# Patient Record
Sex: Female | Born: 1935 | Race: White | Hispanic: No | Marital: Single | Smoking: Never smoker
Health system: Southern US, Community
[De-identification: ages and names within clinical notes are randomized; demographics above are authoritative.]

## PROBLEM LIST (undated history)

## (undated) DIAGNOSIS — I639 Cerebral infarction, unspecified: Secondary | ICD-10-CM

## (undated) DIAGNOSIS — F0391 Unspecified dementia with behavioral disturbance: Secondary | ICD-10-CM

## (undated) DIAGNOSIS — I10 Essential (primary) hypertension: Secondary | ICD-10-CM

## (undated) HISTORY — DX: Unspecified dementia with behavioral disturbance: F03.91

## (undated) HISTORY — PX: APPENDECTOMY: SHX54

## (undated) HISTORY — PX: ANKLE SURGERY: SHX546

## (undated) HISTORY — DX: Essential (primary) hypertension: I10

---

## 2010-02-27 ENCOUNTER — Ambulatory Visit: Payer: Self-pay | Admitting: Family Medicine

## 2010-02-27 ENCOUNTER — Inpatient Hospital Stay (HOSPITAL_COMMUNITY): Admission: EM | Admit: 2010-02-27 | Discharge: 2010-03-03 | Payer: Self-pay | Admitting: Emergency Medicine

## 2010-02-27 ENCOUNTER — Emergency Department (HOSPITAL_COMMUNITY)
Admission: EM | Admit: 2010-02-27 | Discharge: 2010-02-27 | Payer: Self-pay | Source: Home / Self Care | Admitting: Family Medicine

## 2010-02-28 DIAGNOSIS — F4320 Adjustment disorder, unspecified: Secondary | ICD-10-CM

## 2010-03-11 ENCOUNTER — Ambulatory Visit: Payer: Self-pay | Admitting: Family Medicine

## 2010-03-11 DIAGNOSIS — F0391 Unspecified dementia with behavioral disturbance: Secondary | ICD-10-CM

## 2010-03-11 DIAGNOSIS — F03918 Unspecified dementia, unspecified severity, with other behavioral disturbance: Secondary | ICD-10-CM

## 2010-03-11 DIAGNOSIS — I10 Essential (primary) hypertension: Secondary | ICD-10-CM

## 2010-03-11 DIAGNOSIS — R269 Unspecified abnormalities of gait and mobility: Secondary | ICD-10-CM

## 2010-03-11 HISTORY — DX: Unspecified dementia, unspecified severity, with other behavioral disturbance: F03.918

## 2010-03-11 HISTORY — DX: Unspecified dementia with behavioral disturbance: F03.91

## 2010-04-15 ENCOUNTER — Ambulatory Visit: Admit: 2010-04-15 | Payer: Self-pay

## 2010-05-12 NOTE — Assessment & Plan Note (Signed)
Summary: hfu,eo   Vital Signs:  Patient profile:   77 year old female Height:      63.5 inches Weight:      145 pounds BMI:     25.37 BSA:     1.70 Temp:     97.8 degrees F Pulse rate:   74 / minute BP sitting:   170 / 68  Vitals Entered By: Delora Fuel (March 11, 2010 10:30 AM) CC: HFU Is Patient Diabetic? No Pain Assessment Patient in pain? no        CC:  HFU.  History of Present Illness: Gait instability Here for follow up hospitilization see DC summary for details.  According to patient and her son she has been doing well at home.  No falls uses a walker around the home.  Home health, PT and SW are all coming and have rearranged furniture and taken all her rugs.  Dementia mostly short term memory loss.  Not distressing to her.  Son feels is stable  ROS - as above PMH - Medications reviewed and updated in medication list.  Smoking Status noted in VS form    Habits & Providers  Alcohol-Tobacco-Diet     Tobacco Status: never  Current Medications (verified): 1)  Drisdol 16109 Unit Caps (Ergocalciferol) .Marland Kitchen.. 1 Per Week 2)  Aspir-Low 81 Mg Tbec (Aspirin) .Marland Kitchen.. 1 Daily 3)  Multivitamins  Tabs (Multiple Vitamin) .Marland Kitchen.. 1daily  Allergies (verified): No Known Drug Allergies  Social History: Lives by herself.  Son is in frequent contact.  Will have an alert system starting in Dec 11.  Smoking Status:  never  Physical Exam  General:  Well-developed,well-nourished,in no acute distress; alert,appropriate and cooperative throughout examination Lungs:  Normal respiratory effort, chest expands symmetrically. Lungs are clear to auscultation, no crackles or wheezes. Heart:  Normal rate and regular rhythm. S1 and S2 normal without gallop, murmur, click, rub or other extra sounds. Neurologic:  CN 2-7 Normal.  Upper Extremity strength 5/5.  Able to stand on tip toes and heels.  Finger to Nose normal.  Able to rise walk turn return without asst and reasonably rapidly       Impression & Recommendations:  Problem # 1:  GAIT IMBALANCE (ICD-781.2)  Seems to be stable.  On vit d and seeing PT.  Asked to see her eye doctor   Orders: [facility]- Est  Level 4 (60454)  Problem # 2:  DEMENTIA (ICD-294.8)  seems stable.  Will leave discussion of any medicaiton treat for her primary doctor.    Orders: [facility]- Est  Level 4 (99214)  Problem # 3:  ELEVATED BLOOD PRESSURE WITHOUT DIAGNOSIS OF HYPERTENSION (ICD-796.2)  will montior and have HH record at home.   Would not treat unless sys regularluy > 160 or diast > 90-95 BP today: 170/68  Instructed in low sodium diet (DASH Handout) and behavior modification.    Orders: [facility]- Est  Level 4 (09811)  Complete Medication List: 1)  Drisdol 91478 Unit Caps (Ergocalciferol) .Marland Kitchen.. 1 per week 2)  Aspir-low 81 Mg Tbec (Aspirin) .Marland Kitchen.. 1 daily 3)  Multivitamins Tabs (Multiple vitamin) .Marland Kitchen.. 1daily  Patient Instructions: 1)  Come back and see Dr Ashley Royalty in one month 2)  Keep taking the vitamin D once a week 3)  Would start a multivitamin once a day 4)  Have [facility] write down her blood pressures and bring in the readings next visit   Orders Added: 1)  [facility]- Est  Level 4 [29562]

## 2010-05-18 ENCOUNTER — Encounter: Payer: Self-pay | Admitting: *Deleted

## 2010-06-23 LAB — URINALYSIS, ROUTINE W REFLEX MICROSCOPIC
Glucose, UA: NEGATIVE mg/dL
Hgb urine dipstick: NEGATIVE
Specific Gravity, Urine: 1.022 (ref 1.005–1.030)
pH: 5.5 (ref 5.0–8.0)

## 2010-06-23 LAB — CULTURE, BLOOD (ROUTINE X 2)
Culture  Setup Time: 201111190159
Culture  Setup Time: 201111190202
Culture  Setup Time: 201111191258
Culture  Setup Time: 201111191342
Culture: NO GROWTH
Culture: NO GROWTH
Culture: NO GROWTH
Culture: NO GROWTH

## 2010-06-23 LAB — CARDIAC PANEL(CRET KIN+CKTOT+MB+TROPI)
CK, MB: 6.1 ng/mL (ref 0.3–4.0)
Relative Index: 3.8 — ABNORMAL HIGH (ref 0.0–2.5)
Relative Index: 4.2 — ABNORMAL HIGH (ref 0.0–2.5)
Troponin I: 0.16 ng/mL — ABNORMAL HIGH (ref 0.00–0.06)

## 2010-06-23 LAB — COMPREHENSIVE METABOLIC PANEL
ALT: 19 U/L (ref 0–35)
AST: 45 U/L — ABNORMAL HIGH (ref 0–37)
Alkaline Phosphatase: 69 U/L (ref 39–117)
CO2: 23 mEq/L (ref 19–32)
GFR calc Af Amer: 56 mL/min — ABNORMAL LOW (ref >60)
Glucose, Bld: 105 mg/dL — ABNORMAL HIGH (ref 70–99)
Potassium: 3.7 mEq/L (ref 3.5–5.1)
Sodium: 137 mEq/L (ref 135–145)
Total Protein: 6.5 g/dL (ref 6.0–8.3)

## 2010-06-23 LAB — BASIC METABOLIC PANEL WITH GFR
CO2: 23 meq/L (ref 19–32)
Chloride: 108 meq/L (ref 96–112)
GFR calc Af Amer: >60 mL/min (ref >60)
Potassium: 3.4 meq/L — ABNORMAL LOW (ref 3.5–5.1)
Sodium: 138 meq/L (ref 135–145)

## 2010-06-23 LAB — CBC
HCT: 36.4 % (ref 36.0–46.0)
HCT: 39.7 % (ref 36.0–46.0)
HCT: 42 % (ref 36.0–46.0)
Hemoglobin: 13.3 g/dL (ref 12.0–15.0)
Hemoglobin: 14.4 g/dL (ref 12.0–15.0)
MCH: 28.9 pg (ref 26.0–34.0)
MCH: 29.5 pg (ref 26.0–34.0)
MCHC: 33.5 g/dL (ref 30.0–36.0)
MCV: 88 fL (ref 78.0–100.0)
MCV: 88.4 fL (ref 78.0–100.0)
Platelets: 264 10*3/uL (ref 150–400)
Platelets: 276 10*3/uL (ref 150–400)
Platelets: 287 10*3/uL (ref 150–400)
Platelets: 292 10*3/uL (ref 150–400)
RBC: 4.1 MIL/uL (ref 3.87–5.11)
RBC: 4.51 MIL/uL (ref 3.87–5.11)
RDW: 12.8 % (ref 11.5–15.5)
RDW: 13.1 % (ref 11.5–15.5)
RDW: 13.1 % (ref 11.5–15.5)
RDW: 13.2 % (ref 11.5–15.5)
RDW: 13.2 % (ref 11.5–15.5)
WBC: 10 10*3/uL (ref 4.0–10.5)
WBC: 15.4 10*3/uL — ABNORMAL HIGH (ref 4.0–10.5)
WBC: 19.6 10*3/uL — ABNORMAL HIGH (ref 4.0–10.5)
WBC: 7.7 10*3/uL (ref 4.0–10.5)

## 2010-06-23 LAB — BASIC METABOLIC PANEL
BUN: 10 mg/dL (ref 6–23)
BUN: 14 mg/dL (ref 6–23)
BUN: 16 mg/dL (ref 6–23)
BUN: 8 mg/dL (ref 6–23)
CO2: 23 mEq/L (ref 19–32)
Calcium: 9.1 mg/dL (ref 8.4–10.5)
Calcium: 9.3 mg/dL (ref 8.4–10.5)
Calcium: 9.4 mg/dL (ref 8.4–10.5)
Chloride: 109 mEq/L (ref 96–112)
Chloride: 110 mEq/L (ref 96–112)
Creatinine, Ser: 0.77 mg/dL (ref 0.4–1.2)
Creatinine, Ser: 0.79 mg/dL (ref 0.4–1.2)
Creatinine, Ser: 0.94 mg/dL (ref 0.4–1.2)
GFR calc Af Amer: >60 mL/min (ref >60)
GFR calc non Af Amer: 56 mL/min — ABNORMAL LOW (ref >60)
GFR calc non Af Amer: >60 mL/min (ref >60)
GFR calc non Af Amer: >60 mL/min (ref >60)
Glucose, Bld: 109 mg/dL — ABNORMAL HIGH (ref 70–99)
Potassium: 3.6 mEq/L (ref 3.5–5.1)

## 2010-06-23 LAB — DIFFERENTIAL
Basophils Relative: 0 % (ref 0–1)
Eosinophils Absolute: 0 10*3/uL (ref 0.0–0.7)
Eosinophils Relative: 0 % (ref 0–5)
Lymphs Abs: 1 10*3/uL (ref 0.7–4.0)
Monocytes Relative: 6 % (ref 3–12)
Neutrophils Relative %: 89 % — ABNORMAL HIGH (ref 43–77)

## 2010-06-23 LAB — URINE CULTURE

## 2010-06-23 LAB — LIPID PANEL
HDL: 38 mg/dL — ABNORMAL LOW (ref >39)
LDL Cholesterol: 74 mg/dL (ref 0–99)
Triglycerides: 78 mg/dL (ref <150)
VLDL: 16 mg/dL (ref 0–40)

## 2010-06-23 LAB — LACTIC ACID, PLASMA: Lactic Acid, Venous: 2.8 mmol/L — ABNORMAL HIGH (ref 0.5–2.2)

## 2010-06-23 LAB — RAPID URINE DRUG SCREEN, HOSP PERFORMED
Amphetamines: NOT DETECTED
Barbiturates: NOT DETECTED
Opiates: NOT DETECTED

## 2010-06-23 LAB — CK
Total CK: 205 U/L — ABNORMAL HIGH (ref 7–177)
Total CK: 369 U/L — ABNORMAL HIGH (ref 7–177)

## 2010-06-23 LAB — VITAMIN D 25 HYDROXY (VIT D DEFICIENCY, FRACTURES): Vit D, 25-Hydroxy: 27 ng/mL — ABNORMAL LOW (ref 30–89)

## 2010-06-23 LAB — VITAMIN D 1,25 DIHYDROXY
Vitamin D 1, 25 (OH)2 Total: 45 pg/mL (ref 18–72)
Vitamin D2 1, 25 (OH)2: <8 pg/mL
Vitamin D3 1, 25 (OH)2: 45 pg/mL

## 2010-06-23 LAB — URINE MICROSCOPIC-ADD ON

## 2010-06-23 LAB — TSH
TSH: 1.342 u[IU]/mL (ref 0.350–4.500)
TSH: 1.539 u[IU]/mL (ref 0.350–4.500)

## 2010-06-23 LAB — ETHANOL: Alcohol, Ethyl (B): <5 mg/dL (ref 0–10)

## 2010-06-23 LAB — PROTIME-INR: INR: 1.32 (ref 0.00–1.49)

## 2012-08-21 ENCOUNTER — Ambulatory Visit (INDEPENDENT_AMBULATORY_CARE_PROVIDER_SITE_OTHER): Payer: Medicare Other | Admitting: Family Medicine

## 2012-08-21 ENCOUNTER — Encounter: Payer: Self-pay | Admitting: Family Medicine

## 2012-08-21 VITALS — BP 187/108 | HR 69 | Ht 63.5 in | Wt 150.0 lb

## 2012-08-21 DIAGNOSIS — R269 Unspecified abnormalities of gait and mobility: Secondary | ICD-10-CM

## 2012-08-21 DIAGNOSIS — R030 Elevated blood-pressure reading, without diagnosis of hypertension: Secondary | ICD-10-CM

## 2012-08-21 DIAGNOSIS — Z9181 History of falling: Secondary | ICD-10-CM

## 2012-08-21 DIAGNOSIS — L602 Onychogryphosis: Secondary | ICD-10-CM

## 2012-08-21 DIAGNOSIS — L608 Other nail disorders: Secondary | ICD-10-CM

## 2012-08-21 DIAGNOSIS — F068 Other specified mental disorders due to known physiological condition: Secondary | ICD-10-CM

## 2012-08-21 NOTE — Patient Instructions (Signed)
Thank you for coming in, today! For your toenail:  I can remove your toenails, but I can't do it today.  I think it is a better idea for you to be seen by the foot doctor. They are specialists.  They can hopefully trim your nails without having to remove them completely.  They can also look at all of your feet and see if you need anything else.  I will make you a referral and hopefully get you an appointment as soon as possible. Your blood pressure is very high today.  High blood pressure can make it more likely for you to have a heart attack or a stroke.  Blood pressure medicine can make you more likely to fall.  I think for now it is a bigger risk to give you medicine than to just watch your blood pressure. I would like you to come back to see our [facility] doctors.  They specialize in helping take care of older patients.  They will also help make sure that you're doing well and that you're safe. Please feel free to call with any questions or concerns at any time, at [phone]. --Dr. Sharmon Revere office: Please make Shannon Stokes an appointment with [facility] as soon as possible (this week, if able).

## 2012-08-21 NOTE — Progress Notes (Signed)
Subjective:    Patient ID: Shannon Stokes, female    DOB: 1916-05-07, 79 y.o.   MRN: [id number]  HPI: Pt is a 79yo female who presents to establish care and with complaint of "something needs to be done about my toes." Pt was managed by our service in 2011 for brief admission after a fall at home (no identified injuries and ruled out for stroke but was at some point started on daily ASA; pt followed up only once, found to be hypertensive but no treatment initiated at that point). Pt is accompanied by a close friend, Lucilla Edin, who is [name] ([name] has HCPOA paperwork and "desire for natural death" paperwork with him; see A&P). Pt states she "doesn't have any big medical problems" and [name] confirms that pt "typically doesn't like to see doctors, and her last regular doctor passed away, himself, about 20 years ago."  Toenails: Pt complains of thick, very long toenails that are easily bent back and painful, that catch when she changes socks. She denies bleeding or signs of infection around her nails. She has taken no medications nor used any creams for fungus. Pt has never seen podiatry but is interested in having the nails removed or trimmed here or by a podiatrist (this is the pt's main concern today and she states that she "thought that's why she was coming in, today").  HTN: Noted during pt's admission, as above, and at f/u with Dr. Deirdre Priest. No medications started at that time; pt states she has never taken medicine for blood pressure. Pt is not averse to the idea of starting a medication but does state she is concerned that if she takes something her blood pressure might go too low and she could fall (see below).  Dementia: Pt was evaluated by psychiatry during the admission mentioned above and found to have capacity to make her own decisions; no record in EPIC of specific concerns/recommendations other than that. Pt has never been on any medication for dementia. [name] is  pt's HCPOA, as above, but he states that pt is "involved in making her own decisions," but he does have to strongly coax her into coming to the doctor (including this visit, today).  Hx of falls: Pt last fell about two years ago (admission mentioned above); pt has not fallen recently but is "a little unsteady sometimes" on her feet and walks with a cane, and does "have a fear of falling again." [name] checks on her frequently. Pt at one point had [facility] services but does not currently. . [name] expresses some concern that pt is very against the idea of living in a nursing home, but worries that she is not able to completely care for herself.  SH/EOL: Pt lives alone at home, with no family in the area (pt never had children, divorced her husband many years ago, etc). Pt feels safe and able to manage herself at home. Pt is a never smoker, and denies EtOH or other illicit substance use. [name] is pt's HCPOA, as above. Pt has a form stating her "natural death desires," which in summary states that she would not want life-prolonging measures in the event of a terminal/vegetative state. On clarification, pt states she would however want normal therapies initiated as needed in the event of acute illness, up to and including CPR, intubation, and artificial respiration.  PMH as above. Surgical history significant for appendectomy and ankle surgery in the remote past. FH noncontributory. In addition to the  above documentation, PMH, surgical history, FH, SH updated in relevant sections of the EMR.  Review of Systems: As above. Additionally, pt states generally feels well, other than occasional pain in her feet secondary to her nails. Otherwise, full 12-system ROS reviewed and all negative.     Objective:   Physical Exam BP 187/108  Pulse 69  Ht 5' 3.5" (1.613 m)  Wt 150 lb (68.04 kg)  BMI 26.15 kg/m2 Gen: well but frail appearing elderly female, very pleasant, in NAD HEENT: [place]/AT,  MMM,  posterior oropharynx clear  Eyes: PERRLA, sclerae/conjunctivae clear, no lid lag; EOMI  Neck: supple without masses, trachea midline/nondeviated; no meningeal signs; thyroid not enlarged Cardio: RRR, no murmur appreciated; LE without edema Pulm: CTAB, no wheezes; normal work of breathing Abd: soft, nontender, BS+, no HSM appreciated MSK and skin: no frank joint effusions or tenderness; strength equal in bilateral upper and lower extremities  marked thoracic kyphosis; no rashes, no cyanosis/clubbing/edema of extremities  Bilateral feet with very thickened, yellowed nails to all toes but no evidence for cellulitis or other frank bacterial infection  Bilateral great toenails very long (R>L, ~4-5 CENTImeterss), curved/bent, partially ingrown Neuro: sensation grossly intact, no obvious focal deficit  Able to sit/stand unassisted; gait slow but steady with use of cane and minimal assistance Psych: mood euthymic with appropriate affect  Formal MMSE not performed today; pt alert and oriented to self and place, time/circumstance not spefically asked  Pt able to provide remote and recent history reasonably well and her thought content and process appear normal     Assessment & Plan:  Discussed with Dr. Michiel Sites. Summary of old records as above. See A&P notes for full details. Referred to podiatry for management of nails. Referred to geriatric clinic for full MMSE and  recommendations on any medications. Defer starting any medications at this time, for HTN or otherwise. MDM complexity: moderate.

## 2012-08-22 ENCOUNTER — Encounter: Payer: Self-pay | Admitting: Family Medicine

## 2012-08-22 DIAGNOSIS — Z9181 History of falling: Secondary | ICD-10-CM | POA: Insufficient documentation

## 2012-08-22 NOTE — Assessment & Plan Note (Signed)
Noted to be 170/86 in 2011, today measured at 187/108; discussed with Dr. Katrinka Blazing. Likely chronic in nature. Would favor not treating with pharmaceuticals due to age and risk of hypotension, especially given hospitalization for a fall believed due to ?gait imbalance 3 years ago. Referred to geriatric clinic and would appreciate further recommendations.

## 2012-08-22 NOTE — Assessment & Plan Note (Signed)
[name] personal main concern from today's visit. Bilateral great toes with very long, curled/deviated, and partially ingrown nails, right worse than left; other toes with very thick, uneven nails. Pt complains of pain mostly due to great toenail length/shape (catching on socks, being bent back easily, etc). Unable to remove great toenails today (not attempted), and believe pt would be better suited to being seen by podiatry for trimming and/or possible removal of multiple nails. Referred to podiatry today, with pt in agreement.

## 2012-08-22 NOTE — Assessment & Plan Note (Addendum)
A: Discussed with Dr. Katrinka Blazing. Appears at baseline but not formally tested with MMSE, today. Hx of ?old infarct per imaging in 2011 (non-acute left occipetal lesion on CT; negative neck MRA, left ICA aneurysm noted). Pt does not grossly appear entirely unable to make decisions but does have a HCPOA to aid in decision-making, Mr. Lucilla Edin, [facility] [phone], Cell [phone])  P: Requested copies of pt's HCPOA paperwork and "natural death" paperwork to be scanned into EPIC. EOL wishes clarified as per progress note (essentially a full code with parameters to withdraw care in a futile/terminal/vegetative state-type situation). Pt referred to geriatric clinic. Would request that visit assess formal MMSE, further fall risk assessment, and any recommendations for medications. Would favor not treating dementia with pharmaceuticals at this point.

## 2012-08-22 NOTE — Assessment & Plan Note (Signed)
Last fall 2 years ago, without injury at that time; no reported falls in the recent past, but pt lives alone without assistance other than "frequent" visits from her close friend/HCPOA, [name]. Gait and balance seem stable, but pt does use a cane and is likely at very high risk for continued falls. Referred to geriatric clinic, as noted above. Would also consider referral to SW and/or [facility] health coaching for further assessment of HH needs; pt has been adamantly against placement in a long-term care facility but appearntly has had HH services in the past.

## 2012-08-31 ENCOUNTER — Ambulatory Visit: Payer: Medicare Other

## 2012-09-11 ENCOUNTER — Ambulatory Visit (INDEPENDENT_AMBULATORY_CARE_PROVIDER_SITE_OTHER): Payer: Medicare Other | Admitting: Family Medicine

## 2012-09-11 ENCOUNTER — Encounter: Payer: Self-pay | Admitting: Family Medicine

## 2012-09-11 VITALS — BP 177/78 | HR 69 | Ht 63.5 in | Wt 149.0 lb

## 2012-09-11 DIAGNOSIS — F068 Other specified mental disorders due to known physiological condition: Secondary | ICD-10-CM

## 2012-09-11 DIAGNOSIS — I10 Essential (primary) hypertension: Secondary | ICD-10-CM

## 2012-09-11 DIAGNOSIS — L602 Onychogryphosis: Secondary | ICD-10-CM

## 2012-09-11 DIAGNOSIS — L608 Other nail disorders: Secondary | ICD-10-CM

## 2012-09-11 MED ORDER — DONEPEZIL HCL 5 MG PO TABS: 5.0000 mg | ORAL_TABLET | Freq: Every evening | ORAL | Status: DC | PRN

## 2012-09-11 NOTE — Progress Notes (Signed)
Subjective:    Patient ID: Shannon Stokes, female    DOB: 1916-07-07, 79 y.o.   MRN: [id number]  HPI: Pt presents for f/u of ingrown/painful toenails, HTN, and for concerns from HCPOA (friend of family Mr. Lucilla Edin) that pt may not be safe at home by herself.  Dementia/HCPOA concerns: Pt lives alone and does her own housework, feeds/clothes herself, walks with a cane. Pt last fell ~2-3 years ago and does not feel like she is worried about falling again (pt hospitalized at that time, see EMR and my last clinic note); pt states "I get around all right by myself at home, and friends like Rosanne Ashing and his wife come in now and then and check on me." Pt does not like other people whom she does not know (i.e., home health people, etc) to come into her home. Pt has in the past adamantly refused SNF. [name] assists with managing pt's finances and driving her to doctor's offices or to buy groceries, etc, but otherwise she is fairly independent. [name] expresses concern (in private away from pt) that she does not always eat well at home and that she may not be safe due to balance/gait issues, and so on. He states that he knows he has HCPOA and does not want to act against Ms. Melvyn Neth' wishes, but he wants to "do right by her" and wants to make sure she is safe. Pt has had HH services in the past but eventually "got to the point where she didn't feel like she needed them and refused to have them come back."  HTN: Elevated in the past and today, ~170's/80's, not on any medication. No new headaches, weakness, change in vision or sensation. Pt concerned that if her blood pressure gets too low (as with taking medications) he could fall again.  Feet: Pt saw podiatrist shortly after my last visit with her, and they trimmed/cleaned up her toenails. No further pain or new signs of infection. No cream/lotions used on feet. Podiatrist recommended a certain type of shoe which pt has gotten and is wearing, today. Mr.  Merlinda Frederick states pt is supposed to see the podiatrist again in about 1 month.  Review of Systems: As above. Generally feels well.     Objective:   Physical Exam BP 177/78  Pulse 69  Ht 5' 3.5" (1.613 m)  Wt 149 lb (67.586 kg)  BMI 25.98 kg/m2 Gen: well-appearing, well-kempt elderly female in NAD MSK: slow, slightly shuffling gait but stands/walks using cane without other assistance  Feet with improved appearance of toenails s/p trimming at podiatrist office; no frank infection/drainage/etc Psych: oriented to self and intermittently to place (states "doctor's office, in [place], [place]" but then asks, "is this a bank?")  Not oriented to time, intermittently appears oriented to circumstance; very tangential, at times hard to redirect  Very pleasant and not actively agitated or psychotic  MMSE 16/30; completed 7th grade education; points received for:   [place], state, country, location, floor of building ([place], [place], [place], first floor of "doctor's office") - 5   Immediate three-item recall (ball, chair, flag) - 3   Identification of "pen" and "computer monitor" (called it a "TV") - 2   Repeated phrase ("no ifs, ands, or buts") - 1   Followed 3-step command ("take this paper, fold it in half, set it on the table") - 3   Followed written instruction ("CLOSE YOUR EYES") -1    Wrote sentence ("It's time to go home.") - 1  Assessment & Plan:  Precepted with Dr. Mauricio Po with input from Drs. Hensel and Sheffield Slider. See problem list notes for details. This visit lasted in excess of 30 minutes, greater than half of which was spent in direct face-to-face counseling and coordination of care.

## 2012-09-11 NOTE — Assessment & Plan Note (Signed)
Remains elevated but to levels previously seen (~170/80). Likely chronic in nature. Favor NOT treating with pharmaceuticals due to age and risk of HYPOtension, especially given that pt lives alone and manages her own household. Will continue to monitor and may consider treatment if pt eventually goes go into a skilled facility with close supervision.

## 2012-09-11 NOTE — Assessment & Plan Note (Signed)
Toenails with improved appearance s/p podiatry visit (trimmed at that visit, but not prescribed any oral or topical antifungal or other medication). Feet otherwise have chronic skin changes consistent with pt's age, but no evidence for frank infection. Advised pt to f/u with podiatry as needed (reportedly podiatrist recommended every 10 weeks).

## 2012-09-11 NOTE — Patient Instructions (Signed)
Thank you for coming in, today! Your feet look good. Go back to see the foot doctor in July. I am writing a prescription for you called donepezil (Aricept).  This medicine should help with your memory.  It is one pill, 5 mg, that you should take once per day at bedtime.  We might go up on the dose when you come back to see me. Your blood pressure is still high, but I'm okay with not treating it. I don't want it to get too low and cause you to fall. I would like you to come back to see me in about 1 month. We'll talk more about your medicines, then. Please feel free to call with any questions or concerns at any time, at [phone]. --Dr. Casper Harrison

## 2012-09-11 NOTE — Assessment & Plan Note (Addendum)
A: Precepted with Dr. Mauricio Po also with input from Drs. Sheffield Slider and [place]. MMSE 16/30 as above. 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 Pt with limited insight and HCPOA Shannon Stokes is reluctant to exercise HCPOA against pt's wishes, at this time. Pt does not appear to be a danger to herself but is likely at risk for falls and failure to thrive, particularly if dementia is progressing more rapidly than is obvious.  P: Pt agreeable to trial of donepezil 5 mg daily for ~3 months. Will f/u in 1 month and potentially increase donepezil to 10 mg daily. Strongly recommended referral to [facility] for home evaluation (RN, PT) to determine if any services would be appropriate for pt; pt adamantly refuses allowing "people she doesn't know" into her home at this time, and Shannon Stokes prefers to "think it over and talk to her about it." Will route this note to [facility] and [facility] providers to ask for any further insight/recommendations for community services, and will readdress referral for HH eval at f/u. Dr. Mauricio Po brought up consideration of home visit through/with geriatric clinic providers, which could be a potential intervention to help guide decision-making.

## 2012-10-12 ENCOUNTER — Ambulatory Visit: Payer: Medicare Other | Admitting: Family Medicine

## 2012-11-23 ENCOUNTER — Encounter (HOSPITAL_COMMUNITY): Payer: Self-pay | Admitting: Emergency Medicine

## 2012-11-23 ENCOUNTER — Emergency Department (HOSPITAL_COMMUNITY): Payer: Medicare Other

## 2012-11-23 ENCOUNTER — Inpatient Hospital Stay (HOSPITAL_COMMUNITY)
Admission: EM | Admit: 2012-11-23 | Discharge: 2012-11-28 | DRG: 064 | Disposition: A | Discharge status: Skilled Nursing Facility | Payer: Medicare Other | Attending: Family Medicine | Admitting: Family Medicine

## 2012-11-23 DIAGNOSIS — J189 Pneumonia, unspecified organism: Secondary | ICD-10-CM | POA: Diagnosis present

## 2012-11-23 DIAGNOSIS — F0390 Unspecified dementia without behavioral disturbance: Secondary | ICD-10-CM | POA: Diagnosis present

## 2012-11-23 DIAGNOSIS — R000 Tachycardia, unspecified: Secondary | ICD-10-CM | POA: Diagnosis not present

## 2012-11-23 DIAGNOSIS — I63219 Cerebral infarction due to unspecified occlusion or stenosis of unspecified vertebral arteries: Secondary | ICD-10-CM | POA: Diagnosis present

## 2012-11-23 DIAGNOSIS — I6930 Unspecified sequelae of cerebral infarction: Secondary | ICD-10-CM | POA: Diagnosis present

## 2012-11-23 DIAGNOSIS — F0391 Unspecified dementia with behavioral disturbance: Secondary | ICD-10-CM | POA: Diagnosis present

## 2012-11-23 DIAGNOSIS — I480 Paroxysmal atrial fibrillation: Secondary | ICD-10-CM | POA: Clinically undetermined

## 2012-11-23 DIAGNOSIS — R29810 Facial weakness: Secondary | ICD-10-CM | POA: Diagnosis present

## 2012-11-23 DIAGNOSIS — Z7901 Long term (current) use of anticoagulants: Secondary | ICD-10-CM

## 2012-11-23 DIAGNOSIS — Z6825 Body mass index (BMI) 25.0-25.9, adult: Secondary | ICD-10-CM

## 2012-11-23 DIAGNOSIS — I639 Cerebral infarction, unspecified: Secondary | ICD-10-CM

## 2012-11-23 DIAGNOSIS — R269 Unspecified abnormalities of gait and mobility: Secondary | ICD-10-CM | POA: Diagnosis present

## 2012-11-23 DIAGNOSIS — IMO0002 Reserved for concepts with insufficient information to code with codable children: Secondary | ICD-10-CM | POA: Diagnosis not present

## 2012-11-23 DIAGNOSIS — G319 Degenerative disease of nervous system, unspecified: Secondary | ICD-10-CM | POA: Diagnosis present

## 2012-11-23 DIAGNOSIS — G9389 Other specified disorders of brain: Secondary | ICD-10-CM | POA: Diagnosis present

## 2012-11-23 DIAGNOSIS — R159 Full incontinence of feces: Secondary | ICD-10-CM | POA: Diagnosis present

## 2012-11-23 DIAGNOSIS — R2990 Unspecified symptoms and signs involving the nervous system: Secondary | ICD-10-CM

## 2012-11-23 DIAGNOSIS — I10 Essential (primary) hypertension: Secondary | ICD-10-CM | POA: Diagnosis present

## 2012-11-23 DIAGNOSIS — I6340 Cerebral infarction due to embolism of unspecified cerebral artery: Principal | ICD-10-CM | POA: Diagnosis present

## 2012-11-23 DIAGNOSIS — F05 Delirium due to known physiological condition: Secondary | ICD-10-CM | POA: Diagnosis not present

## 2012-11-23 DIAGNOSIS — I4891 Unspecified atrial fibrillation: Secondary | ICD-10-CM | POA: Diagnosis present

## 2012-11-23 DIAGNOSIS — R414 Neurologic neglect syndrome: Secondary | ICD-10-CM | POA: Diagnosis present

## 2012-11-23 DIAGNOSIS — R32 Unspecified urinary incontinence: Secondary | ICD-10-CM | POA: Diagnosis present

## 2012-11-23 DIAGNOSIS — Z8673 Personal history of transient ischemic attack (TIA), and cerebral infarction without residual deficits: Secondary | ICD-10-CM

## 2012-11-23 DIAGNOSIS — R471 Dysarthria and anarthria: Secondary | ICD-10-CM | POA: Diagnosis present

## 2012-11-23 DIAGNOSIS — G8190 Hemiplegia, unspecified affecting unspecified side: Secondary | ICD-10-CM | POA: Diagnosis present

## 2012-11-23 DIAGNOSIS — Z9181 History of falling: Secondary | ICD-10-CM

## 2012-11-23 DIAGNOSIS — Z79899 Other long term (current) drug therapy: Secondary | ICD-10-CM

## 2012-11-23 DIAGNOSIS — I059 Rheumatic mitral valve disease, unspecified: Secondary | ICD-10-CM | POA: Diagnosis present

## 2012-11-23 DIAGNOSIS — Z602 Problems related to living alone: Secondary | ICD-10-CM

## 2012-11-23 DIAGNOSIS — E46 Unspecified protein-calorie malnutrition: Secondary | ICD-10-CM | POA: Diagnosis present

## 2012-11-23 DIAGNOSIS — N390 Urinary tract infection, site not specified: Secondary | ICD-10-CM | POA: Diagnosis present

## 2012-11-23 HISTORY — DX: Cerebral infarction, unspecified: I63.9

## 2012-11-23 LAB — COMPREHENSIVE METABOLIC PANEL
AST: 34 U/L (ref 0–37)
CO2: 20 mEq/L (ref 19–32)
Calcium: 9.7 mg/dL (ref 8.4–10.5)
Creatinine, Ser: 0.86 mg/dL (ref 0.50–1.10)
GFR calc Af Amer: 65 mL/min — ABNORMAL LOW (ref >90)
GFR calc non Af Amer: 56 mL/min — ABNORMAL LOW (ref >90)
Sodium: 138 mEq/L (ref 135–145)
Total Protein: 6.1 g/dL (ref 6.0–8.3)

## 2012-11-23 LAB — DIFFERENTIAL
Basophils Absolute: 0 10*3/uL (ref 0.0–0.1)
Eosinophils Absolute: 0 10*3/uL (ref 0.0–0.7)
Eosinophils Relative: 0 % (ref 0–5)
Lymphocytes Relative: 4 % — ABNORMAL LOW (ref 12–46)
Monocytes Absolute: 1.6 10*3/uL — ABNORMAL HIGH (ref 0.1–1.0)

## 2012-11-23 LAB — CBC
HCT: 43.9 % (ref 36.0–46.0)
MCH: 28.8 pg (ref 26.0–34.0)
MCHC: 33.5 g/dL (ref 30.0–36.0)
MCV: 86.1 fL (ref 78.0–100.0)
Platelets: 166 10*3/uL (ref 150–400)
RDW: 13.6 % (ref 11.5–15.5)
WBC: 17.8 10*3/uL — ABNORMAL HIGH (ref 4.0–10.5)

## 2012-11-23 LAB — PROTIME-INR: Prothrombin Time: 15.3 seconds — ABNORMAL HIGH (ref 11.6–15.2)

## 2012-11-23 LAB — POCT I-STAT TROPONIN I: Troponin i, poc: 0.03 ng/mL (ref 0.00–0.08)

## 2012-11-23 LAB — GLUCOSE, CAPILLARY: Glucose-Capillary: 97 mg/dL (ref 70–99)

## 2012-11-23 MED ORDER — DEXTROSE 50 % IV SOLN
1.0000 | Freq: Once | INTRAVENOUS | Status: DC
  Filled 2012-11-23: qty 50

## 2012-11-23 NOTE — H&P (Signed)
Family Medicine Teaching [facility] Admission History and Physical Service Pager: [phone]  Patient name: Shannon Stokes Medical record number: [id number] Date of birth: 09-18-1916 Age: 79 y.o. Gender: female  Primary Care Provider: Maryjean Ka, MD Consultants: Neurology  Code Status: Full   Chief Complaint: left sides weakness   Assessment and Plan: Shannon Stokes is a 79 y.o. year old female presenting with left sided weakness with suspect of a stroke. She was found to have left sided weaknes with + babinski on the left foot. She was also found to have a WBC 17. 8 with suspected UTI. She had complaints with burning on urination.  Received Rocephin in the ED.  PMH is significant for falls and questionable prior CVA per imaging from 3 years ago.   # Likely stroke: she was found down at home but unable to determine the length of time so TPA was not initiated. At baseline she is MMSE 16/30 but maintains her household. She lives alone but her son helps with daily activites and is the power of attorney.   - Neurology consulted, appreciate recommendations, will proceed with stroke work up.  - neuro checks  - CT: Ill-defined area of low attenuation in the left cerebellar hemisphere is new compared to prior studies and may represent age indeterminate ischemia, further evaluation with MRI  - Plavix 75 mg daily, discontinue ASA  - allow elevated BP 220/120 for 72 hours post stroke - advance diet once nursing administers swallow screen  [ ]  F/u Carotid doppler  [ ]  F/u ECHO  [ ]  F/U Lipid panel, TSH, Hgb A1C  [ ]  F/U PT, OT, SLP  [ ]  F/U Neuro recs  [ ]  F/U MRI/ MRA   # ? UTI with WBC 17.8: complained of burning with urination, afebrile on admission  - received one dose of Rocephin in the ED  [ ]  F/U urine culture   #Malnurtrition: albumin low 3.0  - lives alone, cooks and cleans for herself.  - consider Nutrition c/s  [ ]  f/u Pre-albumin   #HTN: not treated in outpatient  setting due to her age and fear of hypotension  #Social: home health has been brought up in office notes but she declines someone coming to her house.   FEN/GI: NPO + NS  Prophylaxis: enoxaparin   Disposition: admitted to telemetry and family medicine service   History of Present Illness: Shannon Stokes is a 79 y.o. year old female  who was found on the floor but moving around this evening. She was not able to stand and was only complaining of her back hurting. She had right facial droop. She was moving both hands and feet. She was last seen normal around last Thursday and had a normal phone conversation 2 days ago. In June she was found capable of making decisions. She seemed to be normal, Dr. Casper Harrison wanted to initiate home health. Son has power of attorney but has not exercised it. She did not want anyone to come to her house. Son can't go against her with this, respects her wishes. Son pays her bills, she does not pay bills, son takes her shopping and walks around store. She cooks and cleans by herself. She doesn't want to take pills but can take them in the hospital.   She feels short of breath and has abdominal pain. She is able to communicate but is ongoing with her conversations. Some pain when she urinates.   Review Of Systems: Per HPI with the following additions:  none  Otherwise 12 point review of systems was performed and was unremarkable.  Patient Active Problem List   Diagnosis Date Noted  . Personal history of fall 08/22/2012  . Long toenail 08/21/2012  . DEMENTIA 03/11/2010  . GAIT IMBALANCE 03/11/2010  . HTN (hypertension) 03/11/2010   Past Medical History: Past Medical History  Diagnosis Date  . Hypertension   . Stroke    Past Surgical History: Past Surgical History  Procedure Laterality Date  . Appendectomy    . Ankle surgery      "as a child"   Social History: History  Substance Use Topics  . Smoking status: Never Smoker   . Smokeless tobacco: Not on file  .  Alcohol Use: No   Additional social history:  Please also refer to relevant sections of EMR.  Family History: History reviewed. No pertinent family history. Allergies and Medications: No Known Allergies No current facility-administered medications on file prior to encounter.   Current Outpatient Prescriptions on File Prior to Encounter  Medication Sig Dispense Refill  . aspirin (ASPIR-LOW) 81 MG EC tablet Take 81 mg by mouth daily.        . Multiple Vitamin (MULTIVITAMINS PO) Take 1 tablet by mouth daily.        Marland Kitchen donepezil (ARICEPT) 5 MG tablet Take 1 tablet (5 mg total) by mouth at bedtime as needed.  90 tablet  1  . ergocalciferol (DRISDOL) 50000 UNITS capsule Take 50,000 Units by mouth once a week.          Objective: BP 165/65  Pulse 81  Temp(Src) 98.5 F (36.9 C) (Oral)  Resp 17  SpO2 97% Exam: General: NAD, slurred speech, some statements do not make sense with the current conversation  HEENT: EOMI, PERRL, tympanic membranes intact bil, left sided facial droop,  Cardiovascular: S1S2, RRR, no mrg  Respiratory: CTAB, no wheezes/crackles  Abdomen: +BS, soft, non tender, non distended, no organomegaly  Extremities: strength 4/5 on right upper and lower extremities, 3/5 left upper extremity and lower extremity , no edema,  Skin: no rashes, Neuro: Alert, oriented to name, not oriented to place, day of the week, or year. Babinski on left upgoing, babinski is downgoing on right. Not able to test gait, CN 2-12 intact   Labs and Imaging: CBC BMET   Recent Labs Lab 11/23/12 2145  WBC 17.8*  HGB 14.7  HCT 43.9  PLT 166    Recent Labs Lab 11/23/12 2145  NA 138  K 4.1  CL 105  CO2 20  BUN 33*  CREATININE 0.86  GLUCOSE 107*  CALCIUM 9.7    Comparison: Brain MRI 02/28/2010. Head CT 02/27/2010.  CT HEAD  IMPRESSION:  1. Ill-defined area of low attenuation in the left cerebellar  hemisphere is new compared to prior studies and may represent age  indeterminate  ischemia. This could be further evaluated with MRI  of the brain if clinically indicated.  2. Encephalomalacia in the left occipital region related to an old  infarction.  3. Moderate cerebral and cerebellar atrophy with extensive chronic  microvascular ischemic changes in the cerebral white matter, as  Above.  CT CERVICAL SPINE  IMPRESSION:  No evidence of significant acute traumatic injury to the cervical  spine.   Clare Gandy, MD 11/23/2012, 11:59 PM PGY-1, [facility] Family Medicine FPTS Intern pager: [phone], text pages welcome  I have seen and examined Ms. Midgley With Dr. Jordan Likes. I agree with his documentation as stated above, my modifications are noted  in blue.   Murtis Sink, MD [facility] Family Medicine Resident, PGY-2 11/24/2012, 2:18 AM FPTS Intern pager: [phone], text pages welcome

## 2012-11-23 NOTE — ED Provider Notes (Signed)
CSN: [id number]     Arrival date & time 11/23/12  2132 History     First MD Initiated Contact with Patient 11/23/12 2144     Chief Complaint  Patient presents with  . Stroke Symptoms   (Consider location/radiation/quality/duration/timing/severity/associated sxs/prior Treatment) HPI Comments: Pt is a 79 y.o. female with Pmhx as above who presents with AMS, L sided weakness, found laying on groun, LSN 2 days ago by family. On PE, pt awake and alert, though confused.  L sided facial droop, L arm/leg weakness and L sided neglect. Pt says she doesn't feel well.  Denies falls, fever, SP, SOB, ab pain, n/v, d/a, urinary symptoms.   Patient is a 79 y.o. female presenting with neurologic complaint.  Neurologic Problem This is a new problem. Episode onset: unsure, LSN 2 days ago. The problem occurs constantly. The problem has not changed since onset.Pertinent negatives include no chest pain, no abdominal pain, no headaches and no shortness of breath. Associated symptoms comments: Feels "unwell" . Nothing aggravates the symptoms. Nothing relieves the symptoms. She has tried nothing for the symptoms. The treatment provided no relief.    Past Medical History  Diagnosis Date  . Hypertension   . Stroke    Past Surgical History  Procedure Laterality Date  . Appendectomy    . Ankle surgery      "as a child"   History reviewed. No pertinent family history. History  Substance Use Topics  . Smoking status: Never Smoker   . Smokeless tobacco: Not on file  . Alcohol Use: No   OB History   Grav Para Term Preterm Abortions TAB SAB Ect Mult Living                 Review of Systems  Constitutional: Positive for activity change. Negative for fever, chills, diaphoresis, appetite change and fatigue.  HENT: Negative for congestion, sore throat, facial swelling, rhinorrhea, neck pain and neck stiffness.   Eyes: Negative for photophobia and discharge.  Respiratory: Negative for cough, chest tightness  and shortness of breath.   Cardiovascular: Negative for chest pain, palpitations and leg swelling.  Gastrointestinal: Negative for nausea, vomiting, abdominal pain and diarrhea.  Endocrine: Negative for polydipsia and polyuria.  Genitourinary: Negative for dysuria, frequency, difficulty urinating and pelvic pain.  Musculoskeletal: Negative for back pain and arthralgias.  Skin: Negative for color change and wound.  Allergic/Immunologic: Negative for immunocompromised state.  Neurological: Positive for facial asymmetry and weakness. Negative for numbness and headaches.  Hematological: Does not bruise/bleed easily.  Psychiatric/Behavioral: Negative for confusion and agitation.    Allergies  Review of patient's allergies indicates no known allergies.  Home Medications   Current Outpatient Rx  Name  Route  Sig  Dispense  Refill  . aspirin (ASPIR-LOW) 81 MG EC tablet   Oral   Take 81 mg by mouth daily.           . Multiple Vitamin (MULTIVITAMINS PO)   Oral   Take 1 tablet by mouth daily.           Marland Kitchen donepezil (ARICEPT) 5 MG tablet   Oral   Take 1 tablet (5 mg total) by mouth at bedtime as needed.   90 tablet   1   . ergocalciferol (DRISDOL) 50000 UNITS capsule   Oral   Take 50,000 Units by mouth once a week.            BP 165/65  Pulse 81  Temp(Src) 98.5 F (36.9 C) (Oral)  Resp 17  SpO2 97% Physical Exam  Constitutional: She is oriented to person, place, and time. No distress.  disheveled  HENT:  Head: Normocephalic and atraumatic.  Mouth/Throat: No oropharyngeal exudate.  Mucus membranes dry   Eyes: Pupils are equal, round, and reactive to light.  Neck: Normal range of motion. Neck supple.  Cardiovascular: Normal rate, regular rhythm and normal heart sounds.  Exam reveals no gallop and no friction rub.   No murmur heard. Pulmonary/Chest: Effort normal and breath sounds normal. No respiratory distress. She has no wheezes. She has no rales.  Abdominal: Soft.  Bowel sounds are normal. She exhibits no distension and no mass. There is no tenderness. There is no rebound and no guarding.  Musculoskeletal: Normal range of motion. She exhibits no edema and no tenderness.  Neurological: She is alert and oriented to person, place, and time. No cranial nerve deficit or sensory deficit. She exhibits abnormal muscle tone. GCS eye subscore is 4. GCS verbal subscore is 4. GCS motor subscore is 6.  4/5 LUE, 4/5 LLE  Skin: Skin is warm and dry.  Psychiatric: She has a normal mood and affect.    ED Course   Procedures (including critical care time)  Labs Reviewed  PROTIME-INR - Abnormal; Notable for the following:    Prothrombin Time 15.3 (*)    All other components within normal limits  CBC - Abnormal; Notable for the following:    WBC 17.8 (*)    All other components within normal limits  DIFFERENTIAL - Abnormal; Notable for the following:    Neutrophils Relative % 86 (*)    Neutro Abs 15.4 (*)    Lymphocytes Relative 4 (*)    Monocytes Absolute 1.6 (*)    All other components within normal limits  COMPREHENSIVE METABOLIC PANEL - Abnormal; Notable for the following:    Glucose, Bld 107 (*)    BUN 33 (*)    Albumin 3.0 (*)    GFR calc non Af Amer 56 (*)    GFR calc Af Amer 65 (*)    All other components within normal limits  APTT  TROPONIN I  GLUCOSE, CAPILLARY  URINALYSIS, ROUTINE W REFLEX MICROSCOPIC  POCT I-STAT TROPONIN I   Ct Head Wo Contrast  11/23/2012   *RADIOLOGY REPORT*  Clinical Data:  Left-sided weakness.  Left facial droop.  CT HEAD WITHOUT CONTRAST CT CERVICAL SPINE WITHOUT CONTRAST  Technique:  Multidetector CT imaging of the head and cervical spine was performed following the standard protocol without intravenous contrast.  Multiplanar CT image reconstructions of the cervical spine were also generated.  Comparison:  Brain MRI 02/28/2010.  Head CT 02/27/2010.  CT HEAD  Findings: There is an area of low attenuation in the left  cerebellar hemisphere which may represent age indeterminate ischemia (image 7 of series 2).  Well defined region of low attenuation in the left occipital lobe is unchanged and compatible with encephalomalacia related to a remote infarction.  Moderate cerebral and cerebellar atrophy with extensive patchy and confluent areas of decreased attenuation throughout the deep and periventricular white matter of the cerebral hemispheres bilaterally, compatible with chronic microvascular ischemic disease.  No signs of acute intracranial hemorrhage, no mass, mass effect, hydrocephalus or other abnormal intra or extra-axial fluid collections.  Visualized paranasal sinuses and mastoids are well pneumatized.  IMPRESSION: 1.  Ill-defined area of low attenuation in the left cerebellar hemisphere is new compared to prior studies and may represent age indeterminate ischemia.  This could be  further evaluated with MRI of the brain if clinically indicated. 2.  Encephalomalacia in the left occipital region related to an old infarction. 3.  Moderate cerebral and cerebellar atrophy with extensive chronic microvascular ischemic changes in the cerebral white matter, as above.  CT CERVICAL SPINE  Findings: No acute displaced fractures of the cervical spine. Alignment is anatomic.  Prevertebral soft tissues are normal.  Mild multilevel degenerative disc disease, most apparent at C5-C6 and C6- C7.  Mild multilevel facet arthropathy.  Visualized portions of the upper thorax demonstrates mild bilateral apical pleuroparenchymal thickening, most compatible with chronic scarring, but are otherwise unremarkable.  IMPRESSION: No evidence of significant acute traumatic injury to the cervical spine.  Critical Value/emergent results were called by telephone at the time of interpretation on 11/23/2012 at 11:00 p.m. to Dr. Micheline Maze, who verbally acknowledged these results.   Original Report Authenticated By: Trudie Reed, M.D.   Ct Cervical Spine Wo  Contrast  11/23/2012   *RADIOLOGY REPORT*  Clinical Data:  Left-sided weakness.  Left facial droop.  CT HEAD WITHOUT CONTRAST CT CERVICAL SPINE WITHOUT CONTRAST  Technique:  Multidetector CT imaging of the head and cervical spine was performed following the standard protocol without intravenous contrast.  Multiplanar CT image reconstructions of the cervical spine were also generated.  Comparison:  Brain MRI 02/28/2010.  Head CT 02/27/2010.  CT HEAD  Findings: There is an area of low attenuation in the left cerebellar hemisphere which may represent age indeterminate ischemia (image 7 of series 2).  Well defined region of low attenuation in the left occipital lobe is unchanged and compatible with encephalomalacia related to a remote infarction.  Moderate cerebral and cerebellar atrophy with extensive patchy and confluent areas of decreased attenuation throughout the deep and periventricular white matter of the cerebral hemispheres bilaterally, compatible with chronic microvascular ischemic disease.  No signs of acute intracranial hemorrhage, no mass, mass effect, hydrocephalus or other abnormal intra or extra-axial fluid collections.  Visualized paranasal sinuses and mastoids are well pneumatized.  IMPRESSION: 1.  Ill-defined area of low attenuation in the left cerebellar hemisphere is new compared to prior studies and may represent age indeterminate ischemia.  This could be further evaluated with MRI of the brain if clinically indicated. 2.  Encephalomalacia in the left occipital region related to an old infarction. 3.  Moderate cerebral and cerebellar atrophy with extensive chronic microvascular ischemic changes in the cerebral white matter, as above.  CT CERVICAL SPINE  Findings: No acute displaced fractures of the cervical spine. Alignment is anatomic.  Prevertebral soft tissues are normal.  Mild multilevel degenerative disc disease, most apparent at C5-C6 and C6- C7.  Mild multilevel facet arthropathy.   Visualized portions of the upper thorax demonstrates mild bilateral apical pleuroparenchymal thickening, most compatible with chronic scarring, but are otherwise unremarkable.  IMPRESSION: No evidence of significant acute traumatic injury to the cervical spine.  Critical Value/emergent results were called by telephone at the time of interpretation on 11/23/2012 at 11:00 p.m. to Dr. Micheline Maze, who verbally acknowledged these results.   Original Report Authenticated By: Trudie Reed, M.D.   1. Stroke-like symptom     MDM  Pt is a 79 y.o. female with Pmhx as above who presents with AMS, L sided weakness, found laying on groun, LSN 2 days ago by family. On PE, pt awake and alert, though confused.  L sided facial droop, L arm/leg weakness and L sided neglect. Concern for CVA, UTI, eletrolyte abnormality.  CT head showed L cerebellar age-indeterminate  lesion.  WBC elevated w/o clear source, although urine pending.  Neuro consulted, requested medical admit.  Family practice will admit for CVA w/u.   1. Stroke-like symptom       Shanna Cisco, MD 11/24/12 [phone]

## 2012-11-23 NOTE — ED Notes (Signed)
Pt to ED via EMS from home with c/o stroke symptoms, onset x2 days, S/S left sided facial droop, slurred speech, and left sided weakness. Family found pt on floor and called EMS. Pt states she was on the floor since last night. Per EMS, CBG-74, BP-144/98, SpO2-95% on room air, RR-20.

## 2012-11-24 ENCOUNTER — Inpatient Hospital Stay (HOSPITAL_COMMUNITY): Payer: Medicare Other

## 2012-11-24 DIAGNOSIS — G8190 Hemiplegia, unspecified affecting unspecified side: Secondary | ICD-10-CM | POA: Diagnosis present

## 2012-11-24 DIAGNOSIS — I059 Rheumatic mitral valve disease, unspecified: Secondary | ICD-10-CM

## 2012-11-24 DIAGNOSIS — I6930 Unspecified sequelae of cerebral infarction: Secondary | ICD-10-CM | POA: Diagnosis present

## 2012-11-24 LAB — LIPID PANEL
HDL: 67 mg/dL (ref >39)
Total CHOL/HDL Ratio: 2.3 RATIO
VLDL: 14 mg/dL (ref 0–40)

## 2012-11-24 LAB — URINALYSIS, ROUTINE W REFLEX MICROSCOPIC
Glucose, UA: NEGATIVE mg/dL
Protein, ur: 100 mg/dL — AB
Specific Gravity, Urine: 1.025 (ref 1.005–1.030)
Urobilinogen, UA: 0.2 mg/dL (ref 0.0–1.0)

## 2012-11-24 LAB — CBC
Hemoglobin: 14.2 g/dL (ref 12.0–15.0)
MCH: 29.3 pg (ref 26.0–34.0)
MCV: 86.6 fL (ref 78.0–100.0)
RBC: 4.84 MIL/uL (ref 3.87–5.11)

## 2012-11-24 LAB — URINE MICROSCOPIC-ADD ON

## 2012-11-24 LAB — PREALBUMIN: Prealbumin: 12.6 mg/dL — ABNORMAL LOW (ref 17.0–34.0)

## 2012-11-24 LAB — HEMOGLOBIN A1C: Mean Plasma Glucose: 120 mg/dL — ABNORMAL HIGH (ref <117)

## 2012-11-24 MED ORDER — RESOURCE THICKENUP CLEAR PO POWD
ORAL | Status: DC | PRN
  Filled 2012-11-24: qty 125

## 2012-11-24 MED ORDER — SENNOSIDES-DOCUSATE SODIUM 8.6-50 MG PO TABS
1.0000 | ORAL_TABLET | Freq: Every evening | ORAL | Status: DC | PRN
  Filled 2012-11-24: qty 1

## 2012-11-24 MED ORDER — SODIUM CHLORIDE 0.9 % IV SOLN
INTRAVENOUS | Status: DC
  Administered 2012-11-24: 03:00:00 via INTRAVENOUS
  Administered 2012-11-24: 1000 mL via INTRAVENOUS

## 2012-11-24 MED ORDER — HALOPERIDOL LACTATE 5 MG/ML IJ SOLN
0.5000 mg | Freq: Once | INTRAMUSCULAR | Status: AC
  Administered 2012-11-24: 0.5 mg via INTRAVENOUS
  Filled 2012-11-24: qty 0.1

## 2012-11-24 MED ORDER — ONDANSETRON HCL 4 MG/2ML IJ SOLN: 4.0000 mg | Freq: Three times a day (TID) | INTRAMUSCULAR | Status: AC | PRN

## 2012-11-24 MED ORDER — SODIUM CHLORIDE 0.9 % IV SOLN: INTRAVENOUS | Status: DC

## 2012-11-24 MED ORDER — CLOPIDOGREL BISULFATE 75 MG PO TABS
75.0000 mg | ORAL_TABLET | Freq: Every day | ORAL | Status: DC
  Administered 2012-11-24 – 2012-11-26 (×3): 75 mg via ORAL
  Filled 2012-11-24 (×3): qty 1

## 2012-11-24 MED ORDER — ASPIRIN 300 MG RE SUPP
300.0000 mg | Freq: Every day | RECTAL | Status: DC
  Filled 2012-11-24: qty 1

## 2012-11-24 MED ORDER — HALOPERIDOL LACTATE 5 MG/ML IJ SOLN
1.0000 mg | Freq: Once | INTRAMUSCULAR | Status: AC
  Administered 2012-11-24: 1 mg via INTRAVENOUS
  Filled 2012-11-24: qty 0.2

## 2012-11-24 MED ORDER — SODIUM CHLORIDE 0.9 % IV BOLUS (SEPSIS)
500.0000 mL | Freq: Once | INTRAVENOUS | Status: AC
  Administered 2012-11-24: 500 mL via INTRAVENOUS

## 2012-11-24 MED ORDER — ENOXAPARIN SODIUM 40 MG/0.4ML ~~LOC~~ SOLN
40.0000 mg | SUBCUTANEOUS | Status: DC
  Administered 2012-11-24 – 2012-11-26 (×3): 40 mg via SUBCUTANEOUS
  Filled 2012-11-24 (×4): qty 0.4

## 2012-11-24 MED ORDER — HALOPERIDOL 0.5 MG PO TABS
0.5000 mg | ORAL_TABLET | Freq: Once | ORAL | Status: DC
  Filled 2012-11-24: qty 1

## 2012-11-24 MED ORDER — HALOPERIDOL LACTATE 5 MG/ML IJ SOLN
0.5000 mg | Freq: Once | INTRAMUSCULAR | Status: DC
  Filled 2012-11-24: qty 0.1

## 2012-11-24 MED ORDER — DONEPEZIL HCL 5 MG PO TABS
5.0000 mg | ORAL_TABLET | Freq: Every day | ORAL | Status: DC
  Administered 2012-11-24 – 2012-11-27 (×3): 5 mg via ORAL
  Filled 2012-11-24 (×6): qty 1

## 2012-11-24 NOTE — Progress Notes (Addendum)
PCP Note  I appreciate the efforts of the neurology service and FPTS in caring for my patient. I have reviewed Drs. Jordan Likes and Bradshaw's H&P and will plan to see Shannon Stokes this afternoon or tomorrow morning. For clarification, pt's HCPOA, Shannon Stokes is NOT the patient's son. He is a close family friend but he is indeed her surrogate Management consultant. The history as documented otherwise seems consistent with her and his statements/wishes at previous outpatient visits with me (she is pleasantly demented, states she did not want people at home or take many medications, etc; Mr. Shannon Stokes has stated he has been concerned for her living at home alone but has been unwilling to act against her wishes despite her dementia). Of note, I have only seen Shannon Stokes twice in the outpatient setting, so I have not yet had a chance to develope a close therapeutic relationship with her but I will be available to help coordinate her care as best as I am able. Please do not hesitate to contact me if I can be of assistance.  Shannon Morton, MD PGY-2, [facility] Family Medicine Pager: [phone] Cell: [phone]

## 2012-11-24 NOTE — Progress Notes (Signed)
Patient confuse and combative. Pulled out IV and cardiac monitor and is attempting to pull foley out.  Patient is also attempting to climb out of bed. MD on call made aware that patient is without IV access and refusing cardiac monitor. Orders received to medicate with Haldol, but it has not been effective.

## 2012-11-24 NOTE — Evaluation (Addendum)
Clinical/Bedside Swallow Evaluation Patient Details  Name: Shannon Stokes MRN: [id number] Date of Birth: 03/29/1917  Today's Date: 11/24/2012 Time: 0832-0859 SLP Time Calculation (min): 27 min  Past Medical History:  Past Medical History  Diagnosis Date  . Hypertension   . Stroke    Past Surgical History:  Past Surgical History  Procedure Laterality Date  . Appendectomy    . Ankle surgery      "as a child"   HPI:  79 yo female found down at home-concern for neuro event.  Pt also with incr WBC testing + for UTI.   CT head showed left encephalomalacia, left occipital, cerebellar atrophy.  Pt demonstrated weakness on the left side.  She did not pass an RN stroke swallow screen and BSE was ordered.  Pt has h/o CVA, HTN.  Uncertain of new deficits versus old, no family present.  Pt repeats "When can I go home?" several times during session.   Pt lives alone but son helps her per teaching service notes.    Assessment / Plan / Recommendation Clinical Impression  Pt presents with clinical indications of oral dysphagia due to weakness and possible pharyngeal dysphagia with concerns for overt aspiration.  Suspected aspiration characterized by immediate cough with water (with mild respiratory distress).  Pt has a weak cough and voice that could indicate poor airway protection and place this advanced age pt at high aspiration risk.  Rec MBS to allow instrumental testing of swallow.  Pt admits to dysphagia (cough with food/drink) over the last few weeks, uncertain how good of a historian she is currently.      Aspiration Risk  Severe    Diet Recommendation   NPO pending MBS       Other  Recommendations Recommended Consults: MBS   Follow Up Recommendations  24 hour supervision/assistance    Frequency and Duration Other (Comment) (defer until after MBS)      Pertinent Vitals/Pain Afebrile, decreased    SLP Swallow Goals  TBD after MBS   Swallow Study Prior Functional Status   Pt  reports she lives alone    General Date of Onset: 11/24/12 HPI: 79 yo female found down at home.  CT head showed left encephalomalacia, left occipital, cerebellar atrophy.  Pt demonstrated weakness on the left side.  She did not pass an RN stroke swallow screen and BSE was ordered.   Diet Prior to this Study: NPO Temperature Spikes Noted: No Respiratory Status: Supplemental O2 delivered via (comment) History of Recent Intubation: No Behavior/Cognition: Alert;Cooperative;Confused Oral Cavity - Dentition: Adequate natural dentition Self-Feeding Abilities: Able to feed self Patient Positioning: Upright in bed Baseline Vocal Quality: Low vocal intensity Volitional Cough: Weak Volitional Swallow: Able to elicit    Oral/Motor/Sensory Function Overall Oral Motor/Sensory Function: Impaired (left facial and labial droop, dysarthric)   Ice Chips Ice chips: Impaired Presentation: Self Fed Oral Phase Impairments: Reduced lingual movement/coordination;Impaired anterior to posterior transit Oral Phase Functional Implications: Prolonged oral transit Pharyngeal Phase Impairments: Suspected delayed Swallow;Decreased hyoid-laryngeal movement   Thin Liquid Thin Liquid: Impaired Presentation: Self Fed;Cup;Spoon;Straw Oral Phase Impairments: Reduced lingual movement/coordination;Impaired anterior to posterior transit Oral Phase Functional Implications: Prolonged oral transit Pharyngeal  Phase Impairments: Decreased hyoid-laryngeal movement;Multiple swallows;Cough - Immediate Other Comments: strong cough with water via straw and after cracker bolus, ? premature spillage into airway    Nectar Thick Nectar Thick Liquid: Impaired Presentation: Cup Oral Phase Impairments: Reduced lingual movement/coordination;Impaired anterior to posterior transit Oral phase functional implications: Prolonged oral transit Pharyngeal Phase  Impairments: Multiple swallows;Cough - Delayed   Honey Thick Honey Thick Liquid: Not  tested   Puree Puree: Within functional limits Presentation: Spoon;Self Fed   Solid   GO    Solid: Impaired Presentation: Self Fed Oral Phase Impairments: Reduced lingual movement/coordination;Impaired anterior to posterior transit Oral Phase Functional Implications: Oral residue Pharyngeal Phase Impairments: Suspected delayed Swallow;Decreased hyoid-laryngeal movement       Donavan Burnet, MS [facility] SLP [phone]

## 2012-11-24 NOTE — Progress Notes (Signed)
*  PRELIMINARY RESULTS* Vascular Ultrasound Carotid Duplex (Doppler) has been completed.   Findings suggest 1-39% internal carotid artery stenosis bilaterally. Vertebral arteries are patent with antegrade flow.  11/24/2012 12:20 PM Gertie Fey, RVT, RDCS, RDMS

## 2012-11-24 NOTE — H&P (Signed)
Attending Addendum  I examined the patient and discussed the assessment and plan with Dr. Ermalinda Memos. I have reviewed the note and agree.  Briefly, 79 yo F with baseline dementia found down at home by [facility] with new evidence of L cerebellar stroke on CT and L sided facial droop and weakness.   S: patient seen in radiology. Strapped in chair in preparation for swallow eval. She admits to mild frontal headache. Denies CP, SOB, dizziness, MSK pain.   Chest: S1S2, RRR, nml WOB, CTA b/l Neuro: limited exam due to location:  Patient awake and alert.  L inferior Facial droop and nasolabial fold flattening noted CN grossly intact otherwise.  Motor: hand grip equal bilaterally.  LE 4/5 bilaterally Gait and cerebellar function not evaluated   CT head: reviewed.   A/P 79 yo F found down at home, physical exam and neuroimaging suggestive of acute stroke.   1: stroke: stable. HD stable. BP normal.  P: stroke work up: swallow evaluation, MRI/MRA, ECHO, carotid dopplers.  Agree with plavix, ? Compliance with aspirin.   2. Fall: no evidence of trauma. CK slightly elevated. 500 IVF bolus x one, then continue MIVF until patient can eat.   3. Dispo: SW consult to facilitate discussion regarding safe discharge plan. I doubt patient will be safe at home alone.     Dessa Phi, MD FAMILY MEDICINE TEACHING SERVICE

## 2012-11-24 NOTE — Clinical Documentation Improvement (Signed)
THIS DOCUMENT IS NOT A PERMANENT PART OF THE MEDICAL RECORD  Please update your documentation with the medical record to reflect your response to this query. If you need help knowing how to do this please call [phone].  11/24/12   Dear Dr. Jordan Likes,  Per chart note 8/15: patient with "left sided weaknes with + babinski on the left foot". Per Neuro consult note: Left upper extremity: 3/5 and left lower extremity: 3/5.   Please clarify the term "weakness" to more clearly illustrate this patient's severity of illness and risk of mortality. Thank you.  Possible Clinical Conditions? - Left hemiparesis - Other Condition (please specify)   You may use possible, probable, or suspect with inpatient documentation. possible, probable, suspected diagnoses MUST be documented at the time of discharge  Reviewed: additional documentation in the medical record  Thank You,  Beverley Fiedler RN BSN Clinical Documentation Specialist: [phone] Health Information Management [place]

## 2012-11-24 NOTE — Progress Notes (Signed)
See addendum to admission H&P for today's attending assessment and plan.  Dessa Phi C  3:13 PM 11/24/2012

## 2012-11-24 NOTE — ED Notes (Signed)
MD cleared Cspine and removecd C collar

## 2012-11-24 NOTE — Progress Notes (Signed)
Family Medicine Teaching Service Daily Progress Note Intern Pager: [phone]  Patient name: Shannon Stokes Medical record number: [id number] Date of birth: [birth year] Age: 79 y.o. Gender: female  Primary Care Provider: Maryjean Ka, MD Consultants: Neurology  Code Status: Full   Pt Overview and Major Events to Date:    Assessment and Plan: Shannon Stokes is a 79 y.o. year old female presenting with left sided weakness with suspect of a stroke. She was found to have left sided weaknes with + babinski on the left foot. She was also found to have a WBC 17. 8 with suspected UTI. She had complaints with burning on urination. Received Rocephin in the ED. PMH is significant for falls and questionable prior CVA per imaging from 3 years ago.   # Likely stroke: she was found down at home but unable to determine the length of time so TPA was not initiated. At baseline she is MMSE 16/30 but maintains her household. She lives alone but her son helps with daily activites and is the power of attorney.  - Neurology consulted, appreciate recommendations, will proceed with stroke work up.  - neuro checks  - CT: Ill-defined area of low attenuation in the left cerebellar hemisphere is new compared to prior studies and may represent age indeterminate ischemia, further evaluation with MRI  - Plavix 75 mg daily, discontinue ASA  - allow elevated BP 220/120 for 72 hours post stroke  - advance diet once nursing administers swallow screen  - CK 405 - 20.1% ASCVD risk, not in statin benefit group due to age  [ ]  F/u Carotid doppler  [ ]  F/u ECHO  [ ]  F/U  TSH, Hgb A1C  [ ]  F/U PT, OT, SLP  [ ]  F/U Neuro recs  [ ]  F/U MRI/ MRA   # ? UTI with WBC 17.8: complained of burning with urination, afebrile on admission  - received one dose of Rocephin in the ED  - WBC 14.4 (17.8) [ ]  F/U urine culture   #Malnurtrition: albumin low 3.0  - lives alone, cooks and cleans for herself.  - consider Nutrition c/s  [ ]   f/u Pre-albumin   #HTN: not treated in outpatient setting due to her age and fear of hypotension   #Social: home health has been brought up in office notes but she declines someone coming to her house.   FEN/GI: NPO + NS  Prophylaxis: enoxaparin    Disposition:admitted to telemetry and family medicine service   Subjective: Patient was sleeping on exam. She was able to be aroused and had no complaints. She didn't know where she was or what year it was.   Objective: Temp:  [98 F (36.7 C)-98.5 F (36.9 C)] 98.4 F (36.9 C) (08/15 0800) Pulse Rate:  [81-87] 85 (08/15 0800) Resp:  [16-22] 18 (08/15 0800) BP: (112-171)/(56-98) 112/85 mmHg (08/15 0800) SpO2:  [97 %-100 %] 97 % (08/15 0800) Weight:  [150 lb 6.4 oz (68.221 kg)] 150 lb 6.4 oz (68.221 kg) (08/15 0157) Physical Exam: General: NAD, HEENT: EOMI,  left sided facial droop, asymmetric smiling facies  Cardiovascular: S1S2, RRR, no mrg  Respiratory: CTAB, no wheezes/crackles  Abdomen: +BS, soft, non tender, non distended, no organomegaly  Extremities: strength 4/5 on right upper and lower extremities, 3/5 left upper extremity and lower extremity , no edema,  Skin: no rashes,  Neuro: Alert, oriented to name, not oriented to place, day of the week, or year.  Laboratory:  Recent Labs Lab 11/23/12 2145 11/24/12 0500  WBC 17.8* 14.4*  HGB 14.7 14.2  HCT 43.9 41.9  PLT 166 189    Recent Labs Lab 11/23/12 2145  NA 138  K 4.1  CL 105  CO2 20  BUN 33*  CREATININE 0.86  CALCIUM 9.7  PROT 6.1  BILITOT 0.7  ALKPHOS 70  ALT 10  AST 34  GLUCOSE 107*    Urinalysis    Component Value Date/Time   COLORURINE YELLOW 11/24/2012 0129   APPEARANCEUR CLOUDY* 11/24/2012 0129   LABSPEC 1.025 11/24/2012 0129   PHURINE 5.5 11/24/2012 0129   GLUCOSEU NEGATIVE 11/24/2012 0129   HGBUR MODERATE* 11/24/2012 0129   BILIRUBINUR NEGATIVE 11/24/2012 0129   KETONESUR 15* 11/24/2012 0129   PROTEINUR 100* 11/24/2012 0129   UROBILINOGEN 0.2  11/24/2012 0129   NITRITE NEGATIVE 11/24/2012 0129   LEUKOCYTESUR SMALL* 11/24/2012 0129   Lipid Panel     Component Value Date/Time   CHOL 151 11/24/2012 0500   TRIG 70 11/24/2012 0500   HDL 67 11/24/2012 0500   CHOLHDL 2.3 11/24/2012 0500   VLDL 14 11/24/2012 0500   LDLCALC 70 11/24/2012 0500    Imaging/Diagnostic Tests: Comparison: Brain MRI 02/28/2010. Head CT 02/27/2010.  CT HEAD   IMPRESSION:  1. Ill-defined area of low attenuation in the left cerebellar  hemisphere is new compared to prior studies and may represent age  indeterminate ischemia. This could be further evaluated with MRI  of the brain if clinically indicated.  2. Encephalomalacia in the left occipital region related to an old  infarction.  3. Moderate cerebral and cerebellar atrophy with extensive chronic  microvascular ischemic changes in the cerebral white matter, as  Above.   CT CERVICAL SPINE  IMPRESSION:  No evidence of significant acute traumatic injury to the cervical  spine.  8/15: EKG sinus rhythm with sinus arrhythmia with PVC's   Clare Gandy, MD 11/24/2012, 8:53 AM PGY-1, [facility] Family Medicine FPTS Intern pager: [phone], text pages welcome

## 2012-11-24 NOTE — Progress Notes (Signed)
Stroke Team Progress Note  HISTORY Shannon Stokes is an 79 y.o. female who is unable to give any history. History obtained from the chart. It seems that the patient was found on the floor by family 11/23/2012 in the evening. At that time left sided weakness was noted. EMS was called and was brought to the hospital 11/23/2012 at 2132. Patient was last seen by family 2 days prior. Patient gives unreliable history but reports that she was on the floor overnight. Patient was not a TPA candidate secondary to unknown time last known well. She was admitted for further evaluation and treatment.  SUBJECTIVE No family is at the bedside.    OBJECTIVE Most recent Vital Signs: Filed Vitals:   11/24/12 0400 11/24/12 0600 11/24/12 0800 11/24/12 1053  BP: 143/69 146/67 112/85 128/78  Pulse:   85 82  Temp:  98 F (36.7 C) 98.4 F (36.9 C) 98.3 F (36.8 C)  TempSrc:  Axillary Oral Oral  Resp:   18 18  Height:      Weight:      SpO2:   97% 98%   CBG (last 3)   Recent Labs  11/23/12 2245  GLUCAP 97    IV Fluid Intake:   . sodium chloride 125 mL/hr at 11/24/12 0233    MEDICATIONS  . aspirin  300 mg Rectal Daily  . clopidogrel  75 mg Oral Q breakfast  . donepezil  5 mg Oral QHS  . enoxaparin (LOVENOX) injection  40 mg Subcutaneous Q24H   PRN:  ondansetron (ZOFRAN) IV, RESOURCE THICKENUP CLEAR, senna-docusate  Diet:  Dysphagia 1 Puree, nectar thick liquids Activity:  Bedrest, OOB Up with assistance DVT Prophylaxis:  Lovenox 40 mg sq daily   CLINICALLY SIGNIFICANT STUDIES Basic Metabolic Panel:   Recent Labs Lab 11/23/12 2145  NA 138  K 4.1  CL 105  CO2 20  GLUCOSE 107*  BUN 33*  CREATININE 0.86  CALCIUM 9.7   Liver Function Tests:   Recent Labs Lab 11/23/12 2145  AST 34  ALT 10  ALKPHOS 70  BILITOT 0.7  PROT 6.1  ALBUMIN 3.0*   CBC:   Recent Labs Lab 11/23/12 2145 11/24/12 0500  WBC 17.8* 14.4*  NEUTROABS 15.4*  --   HGB 14.7 14.2  HCT 43.9 41.9  MCV 86.1 86.6   PLT 166 189   Coagulation:   Recent Labs Lab 11/23/12 2145  LABPROT 15.3*  INR 1.24   Cardiac Enzymes:   Recent Labs Lab 11/23/12 2145 11/24/12 0500  CKTOTAL  --  405*  TROPONINI <0.30  --    Urinalysis:   Recent Labs Lab 11/24/12 0129  COLORURINE YELLOW  LABSPEC 1.025  PHURINE 5.5  GLUCOSEU NEGATIVE  HGBUR MODERATE*  BILIRUBINUR NEGATIVE  KETONESUR 15*  PROTEINUR 100*  UROBILINOGEN 0.2  NITRITE NEGATIVE  LEUKOCYTESUR SMALL*   Lipid Panel    Component Value Date/Time   CHOL 151 11/24/2012 0500   TRIG 70 11/24/2012 0500   HDL 67 11/24/2012 0500   CHOLHDL 2.3 11/24/2012 0500   VLDL 14 11/24/2012 0500   LDLCALC 70 11/24/2012 0500   HgbA1C  Lab Results  Component Value Date   HGBA1C 5.8* 11/24/2012    Urine Drug Screen:     Component Value Date/Time   LABOPIA NONE DETECTED 02/27/2010 1607   COCAINSCRNUR NONE DETECTED 02/27/2010 1607   LABBENZ NONE DETECTED 02/27/2010 1607   AMPHETMU NONE DETECTED 02/27/2010 1607   THCU NONE DETECTED 02/27/2010 1607   LABBARB  Value:  NONE DETECTED        DRUG SCREEN FOR MEDICAL PURPOSES ONLY.  IF CONFIRMATION IS NEEDED FOR ANY PURPOSE, NOTIFY LAB WITHIN 5 DAYS.        LOWEST DETECTABLE LIMITS FOR URINE DRUG SCREEN Drug Class       Cutoff (ng/mL) Amphetamine      1000 Barbiturate      200 Benzodiazepine   200 Tricyclics       300 Opiates          300 Cocaine          300 THC              50 02/27/2010 1607    Alcohol Level: No results found for this basename: ETH,  in the last 168 hours  CT Cervical Spine 11/23/2012    No evidence of significant acute traumatic injury to the cervical spine.    CT of the brain  11/23/2012   1.  Ill-defined area of low attenuation in the left cerebellar hemisphere is new compared to prior studies and may represent age indeterminate ischemia.  This could be further evaluated with MRI of the brain if clinically indicated. 2.  Encephalomalacia in the left occipital region related to an old infarction.  3.  Moderate cerebral and cerebellar atrophy with extensive chronic microvascular ischemic changes in the cerebral white matter  MRI of the brain    MRA of the brain    2D Echocardiogram    Carotid Doppler  No evidence of hemodynamically significant internal carotid artery stenosis. Vertebral artery flow is antegrade.   CXR    EKG  Sinus rhythm with sinus arrhythmia with occasional Premature ventricular complexes  Therapy Recommendations   Physical Exam   Elderly Caucasian lady restless in bed.restrained.  Afebrile. Head is nontraumatic. Neck is supple without bruit.  . Cardiac exam no murmur or gallop. Lungs are clear to auscultation. Distal pulses are well felt Neurological exam ; limited due to patient's significant baseline dementia and lack of cooperation. Awake and alert follows simple midline commands only. Oriented only to self. Diminished attention, registration and recall. Speaks short sentences. Extraocular movements full range. No facial weakness. Tongue midline. Moves all 4 extremities but left-sided less than right. Mild weakness of left grip and hip flexors. Left plantar equivocal right downgoing. .ASSESSMENT [name] is a 79 y.o. female found down with confusion and left sided weakness. Initial imaging confirms a left cerebellar lesion of unknown age, not associated with current symptoms. MRI pending.  Suspect small right brain infarct. Infarct etiology pending.  On aspirin 81 mg orally every day prior to admission. Now on clopidogrel 75 mg orally every day for secondary stroke prevention. Patient with resultant left hemiparesis. Work up underway.  Hypertension Hx stroke Baseline dementia  Hospital day # 1  TREATMENT/PLAN  Continue clopidogrel 75 mg orally every day for secondary stroke prevention, if patient noncompliant or unable to afford plavix, would recommend aspirin 325 mg po daily.  Follow up MRI, MRA results (if able to be compliant with testing, if  not, repeat CT in am to confirm/refute stroke)  Therapy evals  Likely SNF at discharge. SW following  Annie Main, MSN, RN, ANVP-BC, ANP-BC, [facility] Pager: [phone] 11/24/2012 1:21 PM  I have personally obtained a history, examined the patient, evaluated imaging results, and formulated the assessment and plan of care. I agree with the above. Delia Heady, MD

## 2012-11-24 NOTE — Procedures (Addendum)
Objective Swallowing Evaluation:    Patient Details  Name: Shannon Stokes MRN: [id number] Date of Birth: June 25, 1916  Today's Date: 11/24/2012 Time: 1000-1031 SLP Time Calculation (min): 31 min  Past Medical History:  Past Medical History  Diagnosis Date  . Hypertension   . Stroke    Past Surgical History:  Past Surgical History  Procedure Laterality Date  . Appendectomy    . Ankle surgery      "as a child"   HPI:  79 yo female found down at home.  CT head showed left encephalomalacia, left occipital, cerebellar atrophy.  Pt demonstrated weakness on the left side.  She did not pass an RN stroke swallow screen and BSE was ordered.       Assessment / Plan / Recommendation Clinical Impression  Dysphagia Diagnosis: Moderate oral phase dysphagia;Moderate pharyngeal phase dysphagia Clinical impression: Moderate sensorimotor oropharyngeal dyphagia with decreased oral bolus control due to weakness resulting in premature spillage of boluses (and oral residuals) into pharynx.  Pt also with delayed pharyngeal swallow response due to sensory deficits and stasis due to weakness.  Trace SILENT aspiration of thin observed during the swallow as barium spilled into larynx.  Cued cough cleared aspirates, however deep laryngeal penetration continued and placed pt at greater risk.  Chin tuck not helpful to prevent penetration of thin or nectar.  Although pt did not aspirate honey thick liquids, increased pharyngeal residuals noted and cued cough cleared trace nectar aspirates/penetrates.  Pt required verbal cues to conduct dry swallows to help clear pharynx.  Pt will require absolute full supervision as her current cognition does not allow her to recall strategies and her sensorimotor impairments make her a high aspiration risk.  She will require repeat MBS due to silent nature of dysphagia.  Skilled intervention included educating pt to findings using video monitor for feedback, compensatory strategy usage to  mitigate aspiration risk.  Rec intiate puree/nectar diet.      Treatment Recommendation  Therapy as outlined in treatment plan below    Diet Recommendation Dysphagia 1 (Puree);Nectar-thick liquid (extra gravy and sauce)   Liquid Administration via: Cup;No straw Medication Administration: Crushed with puree Supervision: Patient able to self feed;Full supervision/cueing for compensatory strategies (ABSOLUTE FULL SUPERVISION) Compensations: Slow rate;Small sips/bites;Check for pocketing;Multiple dry swallows after each bite/sip;Hard cough after swallow Postural Changes and/or Swallow Maneuvers: Seated upright 90 degrees;Upright 30-60 min after meal    Other  Recommendations Recommended Consults: MBS Oral Care Recommendations: Oral care before and after PO Other Recommendations: Order thickener from pharmacy   Follow Up Recommendations  24 hour supervision/assistance    Frequency and Duration min 2x/week  2 weeks   Pertinent Vitals/Pain Decreased, afebrile    SLP Swallow Goals Patient will utilize recommended strategies during swallow to increase swallowing safety with: Total assistance   General Date of Onset: 11/24/12 HPI: 79 yo female found down at home.  CT head showed left encephalomalacia, left occipital, cerebellar atrophy.  Pt demonstrated weakness on the left side.  She did not pass an RN stroke swallow screen and BSE was ordered.   Diet Prior to this Study: NPO Temperature Spikes Noted: No Respiratory Status: Supplemental O2 delivered via (comment) History of Recent Intubation: No Behavior/Cognition: Alert;Cooperative;Confused Oral Cavity - Dentition: Adequate natural dentition Self-Feeding Abilities: Able to feed self Patient Positioning: Upright in bed Baseline Vocal Quality: Low vocal intensity Volitional Cough: Weak Volitional Swallow: Able to elicit    Reason for Referral     Oral Phase Oral Preparation/Oral Phase Oral  Phase: Impaired Oral - Nectar Oral -  Nectar Teaspoon: Weak lingual manipulation;Delayed oral transit;Lingual pumping;Reduced posterior propulsion;Lingual/palatal residue;Piecemeal swallowing Oral - Nectar Cup: Reduced posterior propulsion;Weak lingual manipulation;Delayed oral transit;Lingual/palatal residue;Piecemeal swallowing Oral - Thin Oral - Thin Teaspoon: Reduced posterior propulsion;Delayed oral transit;Weak lingual manipulation;Lingual/palatal residue;Piecemeal swallowing Oral - Thin Cup: Reduced posterior propulsion;Piecemeal swallowing;Weak lingual manipulation;Delayed oral transit Oral - Thin Straw: Weak lingual manipulation;Delayed oral transit;Piecemeal swallowing Oral - Solids Oral - Puree: Delayed oral transit;Reduced posterior propulsion;Piecemeal swallowing;Weak lingual manipulation Oral - Regular: Piecemeal swallowing;Impaired mastication;Lingual/palatal residue;Reduced posterior propulsion;Weak lingual manipulation;Delayed oral transit   Pharyngeal Phase Pharyngeal Phase Pharyngeal Phase: Impaired Pharyngeal - Honey Pharyngeal - Honey Teaspoon: Delayed swallow initiation;Premature spillage to valleculae;Pharyngeal residue - valleculae;Pharyngeal residue - pyriform sinuses;Reduced airway/laryngeal closure;Reduced laryngeal elevation;Reduced tongue base retraction;Reduced pharyngeal peristalsis;Reduced anterior laryngeal mobility;Reduced epiglottic inversion Pharyngeal - Nectar Pharyngeal - Nectar Teaspoon: Delayed swallow initiation;Premature spillage to valleculae;Pharyngeal residue - pyriform sinuses;Pharyngeal residue - valleculae;Reduced tongue base retraction;Reduced laryngeal elevation;Reduced pharyngeal peristalsis;Reduced airway/laryngeal closure;Reduced anterior laryngeal mobility;Reduced epiglottic inversion Pharyngeal - Nectar Cup: Delayed swallow initiation;Premature spillage to valleculae;Reduced pharyngeal peristalsis;Reduced epiglottic inversion;Penetration/Aspiration during  swallow Penetration/Aspiration details (nectar cup): Material enters airway, remains ABOVE vocal cords and not ejected out Pharyngeal - Nectar Straw: Delayed swallow initiation;Premature spillage to valleculae;Reduced pharyngeal peristalsis;Reduced tongue base retraction;Reduced airway/laryngeal closure;Reduced laryngeal elevation;Penetration/Aspiration during swallow Penetration/Aspiration details (nectar straw): Material enters airway, passes BELOW cords without attempt by patient to eject out (silent aspiration);Material enters airway, CONTACTS cords and not ejected out;Material enters airway, remains ABOVE vocal cords and not ejected out (chin tuck posture attempted) Pharyngeal - Thin Pharyngeal - Thin Teaspoon: Delayed swallow initiation;Premature spillage to valleculae;Pharyngeal residue - valleculae;Pharyngeal residue - pyriform sinuses Pharyngeal - Thin Cup: Delayed swallow initiation;Premature spillage to valleculae;Reduced airway/laryngeal closure;Reduced laryngeal elevation;Reduced pharyngeal peristalsis;Reduced epiglottic inversion;Pharyngeal residue - valleculae;Pharyngeal residue - pyriform sinuses;Reduced tongue base retraction;Trace aspiration;Penetration/Aspiration during swallow;Lateral channel residue Penetration/Aspiration details (thin cup): Material enters airway, passes BELOW cords without attempt by patient to eject out (silent aspiration) Pharyngeal - Thin Straw: Delayed swallow initiation;Premature spillage to valleculae;Penetration/Aspiration during swallow;Reduced laryngeal elevation;Reduced pharyngeal peristalsis;Reduced epiglottic inversion;Pharyngeal residue - valleculae;Pharyngeal residue - pyriform sinuses;Lateral channel residue Penetration/Aspiration details (thin straw): Material enters airway, passes BELOW cords without attempt by patient to eject out (silent aspiration) Pharyngeal - Solids Pharyngeal - Puree: Delayed swallow initiation;Premature spillage to  valleculae;Pharyngeal residue - valleculae;Reduced pharyngeal peristalsis;Reduced epiglottic inversion;Reduced anterior laryngeal mobility;Reduced laryngeal elevation;Reduced airway/laryngeal closure;Reduced tongue base retraction Pharyngeal - Regular: Delayed swallow initiation;Premature spillage to valleculae;Reduced tongue base retraction;Reduced epiglottic inversion;Reduced pharyngeal peristalsis;Pharyngeal residue - valleculae Pharyngeal Phase - Comment Pharyngeal Comment: head turn left and chin tuck were not helpful  Cervical Esophageal Phase    GO    Cervical Esophageal Phase Cervical Esophageal Phase: Impaired Cervical Esophageal Phase - Honey Honey Teaspoon: Reduced cricopharyngeal relaxation Cervical Esophageal Phase - Nectar Nectar Teaspoon: Reduced cricopharyngeal relaxation Nectar Cup: Reduced cricopharyngeal relaxation Nectar Straw: Reduced cricopharyngeal relaxation Cervical Esophageal Phase - Thin Thin Teaspoon: Reduced cricopharyngeal relaxation Thin Cup: Reduced cricopharyngeal relaxation Thin Straw: Reduced cricopharyngeal relaxation Cervical Esophageal Phase - Solids Puree: Reduced cricopharyngeal relaxation Regular: Reduced cricopharyngeal relaxation        Donavan Burnet, MS [facility] SLP [phone]

## 2012-11-24 NOTE — Consult Note (Signed)
Referring Physician: Micheline Maze    Chief Complaint: Left sided weakness  HPI: Shannon Stokes is an 79 y.o. female who is unable to give any history this evening.  History obtained from the chart.  It seems that the patient was found on the floor by family.  At that time left sided weakness was noted.  EMS was called.  Patient was last seen by family 2 days prior.  Patient gives unreliable history but reports that she was on the floor overnight.    Date last known well: Unable to determine Time last known well: Unable to determine tPA Given: No: Outside time window  Past Medical History  Diagnosis Date  . Hypertension   . Stroke     Past Surgical History  Procedure Laterality Date  . Appendectomy    . Ankle surgery      "as a child"    Family history; Patient with AMS-unable to obtain  Social History:  reports that she has never smoked. She does not have any smokeless tobacco history on file. She reports that she does not drink alcohol. Her drug history is not on file.  Allergies: No Known Allergies  Medications: I have reviewed the patient's current medications. Prior to Admission:  Current outpatient prescriptions: aspirin (ASPIR-LOW) 81 MG EC tablet, Take 81 mg by mouth daily.  , Disp: , Rfl: ;   Multiple Vitamin (MULTIVITAMINS PO), Take 1 tablet by mouth daily.  , Disp: , Rfl: ;   donepezil (ARICEPT) 5 MG tablet, Take 1 tablet (5 mg total) by mouth at bedtime as needed., Disp: 90 tablet, Rfl: 1;   ergocalciferol (DRISDOL) 50000 UNITS capsule, Take 50,000 Units by mouth once a week.  , Disp: , Rfl:   ROS: History obtained from the patient  General ROS: negative for - chills, fatigue, fever, night sweats, weight gain or weight loss Psychological ROS: negative for - behavioral disorder, hallucinations, memory difficulties, mood swings or suicidal ideation Ophthalmic ROS: negative for - blurry vision, double vision, eye pain or loss of vision ENT ROS: negative for - epistaxis,  nasal discharge, oral lesions, sore throat, tinnitus or vertigo Allergy and Immunology ROS: negative for - hives or itchy/watery eyes Hematological and Lymphatic ROS: negative for - bleeding problems, bruising or swollen lymph nodes Endocrine ROS: negative for - galactorrhea, hair pattern changes, polydipsia/polyuria or temperature intolerance Respiratory ROS: negative for - cough, hemoptysis, shortness of breath or wheezing Cardiovascular ROS: negative for - chest pain, dyspnea on exertion, edema or irregular heartbeat Gastrointestinal ROS: negative for - abdominal pain, diarrhea, hematemesis, nausea/vomiting or stool incontinence Genito-Urinary ROS: negative for - dysuria, hematuria, incontinence or urinary frequency/urgency Musculoskeletal ROS: negative for - joint swelling or muscular weakness Neurological ROS: as noted in HPI Dermatological ROS: negative for rash and skin lesion changes  Physical Examination: Blood pressure 165/65, pulse 81, temperature 98.5 F (36.9 C), temperature source Oral, resp. rate 17, SpO2 97.00%.  Neurologic Examination: Mental Status: Alert.  Reports that it is 1992.  Thinks she is in her mother's home upstairs.  Speech is fluent but slurred.  Able to follow simple commands without difficulty. Cranial Nerves: II: Discs flat bilaterally; Visual fields grossly normal, pupils equal, round, reactive to light and accommodation III,IV, VI: ptosis not present, extra-ocular motions intact bilaterally V,VII: left facial droop, facial light touch sensation normal bilaterally VIII: hearing normal bilaterally IX,X: gag reflex present XI: bilateral shoulder shrug XII: midline tongue extension Motor: Right : Upper extremity   5/5    Left:  Upper extremity   3/5  Lower extremity   5/5     Lower extremity   3-/5 Tone and bulk:normal tone throughout; no atrophy noted Sensory: Pinprick and light touch intact throughout, bilaterally Deep Tendon Reflexes: 2+ and  symmetric throughout Plantars: Right: upgoing   Left: upgoing Cerebellar: normal finger-to-nose and normal heel-to-shin testing on the right Gait: Unable to test CV: pulses palpable throughout   Laboratory Studies:  Basic Metabolic Panel:  Recent Labs Lab 11/23/12 2145  NA 138  K 4.1  CL 105  CO2 20  GLUCOSE 107*  BUN 33*  CREATININE 0.86  CALCIUM 9.7    Liver Function Tests:  Recent Labs Lab 11/23/12 2145  AST 34  ALT 10  ALKPHOS 70  BILITOT 0.7  PROT 6.1  ALBUMIN 3.0*   No results found for this basename: LIPASE, AMYLASE,  in the last 168 hours No results found for this basename: AMMONIA,  in the last 168 hours  CBC:  Recent Labs Lab 11/23/12 2145  WBC 17.8*  NEUTROABS 15.4*  HGB 14.7  HCT 43.9  MCV 86.1  PLT 166    Cardiac Enzymes:  Recent Labs Lab 11/23/12 2145  TROPONINI <0.30    BNP: No components found with this basename: POCBNP,   CBG:  Recent Labs Lab 11/23/12 2245  GLUCAP 97    Microbiology: Results for orders placed during the hospital encounter of 02/27/10  URINE CULTURE     Status: None   Collection Time    02/27/10  4:07 PM      Result Value Range Status   Specimen Description URINE, CATHETERIZED   Final   Special Requests NONE   Final   Culture  Setup Time [id number]   Final   Colony Count NO GROWTH   Final   Culture NO GROWTH   Final   Report Status 03/01/2010 FINAL   Final  CULTURE, BLOOD (ROUTINE X 2)     Status: None   Collection Time    02/27/10  8:30 PM      Result Value Range Status   Specimen Description BLOOD ARM LEFT   Final   Special Requests BOTTLES DRAWN AEROBIC AND ANAEROBIC B 10CC R 5CC   Final   Culture  Setup Time [id number]   Final   Culture NO GROWTH 5 DAYS   Final   Report Status 03/06/2010 FINAL   Final  CULTURE, BLOOD (ROUTINE X 2)     Status: None   Collection Time    02/27/10  8:35 PM      Result Value Range Status   Specimen Description BLOOD HAND RIGHT   Final   Special  Requests BOTTLES DRAWN AEROBIC ONLY 2.5CC   Final   Culture  Setup Time [id number]   Final   Culture NO GROWTH 5 DAYS   Final   Report Status 03/06/2010 FINAL   Final  CULTURE, BLOOD (ROUTINE X 2)     Status: None   Collection Time    02/28/10  9:00 AM      Result Value Range Status   Specimen Description BLOOD RIGHT ARM   Final   Special Requests BOTTLES DRAWN AEROBIC AND ANAEROBIC 5CC   Final   Culture  Setup Time [id number]   Final   Culture NO GROWTH 5 DAYS   Final   Report Status 03/06/2010 FINAL   Final  CULTURE, BLOOD (ROUTINE X 2)     Status: None   Collection Time    02/28/10  9:20 AM      Result Value Range Status   Specimen Description BLOOD RIGHT HAND   Final   Special Requests BOTTLES DRAWN AEROBIC ONLY 10 CC   Final   Culture  Setup Time [id number]   Final   Culture NO GROWTH 5 DAYS   Final   Report Status 03/06/2010 FINAL   Final    Coagulation Studies:  Recent Labs  11/23/12 2145  LABPROT 15.3*  INR 1.24    Urinalysis: No results found for this basename: COLORURINE, APPERANCEUR, LABSPEC, PHURINE, GLUCOSEU, HGBUR, BILIRUBINUR, KETONESUR, PROTEINUR, UROBILINOGEN, NITRITE, LEUKOCYTESUR,  in the last 168 hours  Lipid Panel:    Component Value Date/Time   CHOL  Value: 128        ATP III CLASSIFICATION:  <200     mg/dL   Desirable  811-914  mg/dL   Borderline High  >=782    mg/dL   High        95/62/1308 0430   TRIG 78 03/01/2010 0430   HDL 38* 03/01/2010 0430   CHOLHDL 3.4 03/01/2010 0430   VLDL 16 03/01/2010 0430   LDLCALC  Value: 74        Total Cholesterol/HDL:CHD Risk Coronary Heart Disease Risk Table                     Men   Women  1/2 Average Risk   3.4   3.3  Average Risk       5.0   4.4  2 X Average Risk   9.6   7.1  3 X Average Risk  23.4   11.0        Use the calculated Patient Ratio above and the CHD Risk Table to determine the patient's CHD Risk.        ATP III CLASSIFICATION (LDL):  <100     mg/dL   Optimal  657-846  mg/dL   Near or Above                     Optimal  130-159  mg/dL   Borderline  962-952  mg/dL   High  >841     mg/dL   Very High 32/44/0102 0430    HgbA1C:  No results found for this basename: HGBA1C    Urine Drug Screen:     Component Value Date/Time   LABOPIA NONE DETECTED 02/27/2010 1607   COCAINSCRNUR NONE DETECTED 02/27/2010 1607   LABBENZ NONE DETECTED 02/27/2010 1607   AMPHETMU NONE DETECTED 02/27/2010 1607   THCU NONE DETECTED 02/27/2010 1607   LABBARB  Value: NONE DETECTED        DRUG SCREEN FOR MEDICAL PURPOSES ONLY.  IF CONFIRMATION IS NEEDED FOR ANY PURPOSE, NOTIFY LAB WITHIN 5 DAYS.        LOWEST DETECTABLE LIMITS FOR URINE DRUG SCREEN Drug Class       Cutoff (ng/mL) Amphetamine      1000 Barbiturate      200 Benzodiazepine   200 Tricyclics       300 Opiates          300 Cocaine          300 THC              50 02/27/2010 1607    Alcohol Level: No results found for this basename: ETH,  in the last 168 hours   Imaging: Ct Head Wo Contrast  11/23/2012   *RADIOLOGY REPORT*  Clinical Data:  Left-sided weakness.  Left facial droop.  CT HEAD WITHOUT CONTRAST CT CERVICAL SPINE WITHOUT CONTRAST  Technique:  Multidetector CT imaging of the head and cervical spine was performed following the standard protocol without intravenous contrast.  Multiplanar CT image reconstructions of the cervical spine were also generated.  Comparison:  Brain MRI 02/28/2010.  Head CT 02/27/2010.  CT HEAD  Findings: There is an area of low attenuation in the left cerebellar hemisphere which may represent age indeterminate ischemia (image 7 of series 2).  Well defined region of low attenuation in the left occipital lobe is unchanged and compatible with encephalomalacia related to a remote infarction.  Moderate cerebral and cerebellar atrophy with extensive patchy and confluent areas of decreased attenuation throughout the deep and periventricular white matter of the cerebral hemispheres bilaterally, compatible with chronic microvascular  ischemic disease.  No signs of acute intracranial hemorrhage, no mass, mass effect, hydrocephalus or other abnormal intra or extra-axial fluid collections.  Visualized paranasal sinuses and mastoids are well pneumatized.  IMPRESSION: 1.  Ill-defined area of low attenuation in the left cerebellar hemisphere is new compared to prior studies and may represent age indeterminate ischemia.  This could be further evaluated with MRI of the brain if clinically indicated. 2.  Encephalomalacia in the left occipital region related to an old infarction. 3.  Moderate cerebral and cerebellar atrophy with extensive chronic microvascular ischemic changes in the cerebral white matter, as above.  CT CERVICAL SPINE  Findings: No acute displaced fractures of the cervical spine. Alignment is anatomic.  Prevertebral soft tissues are normal.  Mild multilevel degenerative disc disease, most apparent at C5-C6 and C6- C7.  Mild multilevel facet arthropathy.  Visualized portions of the upper thorax demonstrates mild bilateral apical pleuroparenchymal thickening, most compatible with chronic scarring, but are otherwise unremarkable.  IMPRESSION: No evidence of significant acute traumatic injury to the cervical spine.  Critical Value/emergent results were called by telephone at the time of interpretation on 11/23/2012 at 11:00 p.m. to Dr. Micheline Maze, who verbally acknowledged these results.   Original Report Authenticated By: Trudie Reed, M.D.   Ct Cervical Spine Wo Contrast  11/23/2012   *RADIOLOGY REPORT*  Clinical Data:  Left-sided weakness.  Left facial droop.  CT HEAD WITHOUT CONTRAST CT CERVICAL SPINE WITHOUT CONTRAST  Technique:  Multidetector CT imaging of the head and cervical spine was performed following the standard protocol without intravenous contrast.  Multiplanar CT image reconstructions of the cervical spine were also generated.  Comparison:  Brain MRI 02/28/2010.  Head CT 02/27/2010.  CT HEAD  Findings: There is an area of  low attenuation in the left cerebellar hemisphere which may represent age indeterminate ischemia (image 7 of series 2).  Well defined region of low attenuation in the left occipital lobe is unchanged and compatible with encephalomalacia related to a remote infarction.  Moderate cerebral and cerebellar atrophy with extensive patchy and confluent areas of decreased attenuation throughout the deep and periventricular white matter of the cerebral hemispheres bilaterally, compatible with chronic microvascular ischemic disease.  No signs of acute intracranial hemorrhage, no mass, mass effect, hydrocephalus or other abnormal intra or extra-axial fluid collections.  Visualized paranasal sinuses and mastoids are well pneumatized.  IMPRESSION: 1.  Ill-defined area of low attenuation in the left cerebellar hemisphere is new compared to prior studies and may represent age indeterminate ischemia.  This could be further evaluated with MRI of the brain if clinically indicated. 2.  Encephalomalacia in the left occipital region related to an old infarction.  3.  Moderate cerebral and cerebellar atrophy with extensive chronic microvascular ischemic changes in the cerebral white matter, as above.  CT CERVICAL SPINE  Findings: No acute displaced fractures of the cervical spine. Alignment is anatomic.  Prevertebral soft tissues are normal.  Mild multilevel degenerative disc disease, most apparent at C5-C6 and C6- C7.  Mild multilevel facet arthropathy.  Visualized portions of the upper thorax demonstrates mild bilateral apical pleuroparenchymal thickening, most compatible with chronic scarring, but are otherwise unremarkable.  IMPRESSION: No evidence of significant acute traumatic injury to the cervical spine.  Critical Value/emergent results were called by telephone at the time of interpretation on 11/23/2012 at 11:00 p.m. to Dr. Micheline Maze, who verbally acknowledged these results.   Original Report Authenticated By: Trudie Reed, M.D.     Assessment: 79 y.o. female presenting after being found down with confusion and left sided weakness.  Patient with risk factor of hypertension.  CT reviewed and shows an area of hypodensity in the left cerebellum-questionably infarct of uncertain age.  This is not consistent with the patient's examination.  Would expect further findings with further imaging.    Stroke Risk Factors - hypertension  Plan: 1. HgbA1c, fasting lipid panel 2. MRI, MRA  of the brain without contrast 3. PT consult, OT consult, Speech consult 4. Echocardiogram 5. Carotid dopplers 6. Prophylactic therapy-Antiplatelet med: Plavix - dose 75mg  daily 7. Telemetry monitoring 8. Frequent neuro checks   Thana Farr, MD [facility] [phone] 11/24/2012, 12:49 AM

## 2012-11-24 NOTE — ED Notes (Signed)
  Pt does not have any family here in [place]. Pts neighbors and primary care givers are Christene Lye cell: [phone] hm: [phone]

## 2012-11-24 NOTE — Progress Notes (Signed)
  Echocardiogram 2D Echocardiogram has been performed.  [place] 11/24/2012, 1:09 PM

## 2012-11-24 NOTE — Progress Notes (Signed)
Utilization review completed.  

## 2012-11-25 ENCOUNTER — Inpatient Hospital Stay (HOSPITAL_COMMUNITY): Payer: Medicare Other

## 2012-11-25 LAB — URINE MICROSCOPIC-ADD ON

## 2012-11-25 LAB — GLUCOSE, CAPILLARY: Glucose-Capillary: 98 mg/dL (ref 70–99)

## 2012-11-25 LAB — URINALYSIS, ROUTINE W REFLEX MICROSCOPIC
Ketones, ur: 40 mg/dL — AB
Protein, ur: 30 mg/dL — AB
Urobilinogen, UA: 1 mg/dL (ref 0.0–1.0)

## 2012-11-25 LAB — TROPONIN I: Troponin I: <0.3 ng/mL (ref <0.30)

## 2012-11-25 MED ORDER — KCL IN DEXTROSE-NACL 20-5-0.45 MEQ/L-%-% IV SOLN
INTRAVENOUS | Status: DC
  Administered 2012-11-25 – 2012-11-26 (×3): via INTRAVENOUS
  Filled 2012-11-25 (×7): qty 1000

## 2012-11-25 MED ORDER — ACETAMINOPHEN 325 MG PO TABS
650.0000 mg | ORAL_TABLET | Freq: Four times a day (QID) | ORAL | Status: AC
  Administered 2012-11-25 – 2012-11-26 (×4): 650 mg via ORAL
  Filled 2012-11-25 (×4): qty 2

## 2012-11-25 MED ORDER — AZITHROMYCIN 500 MG PO TABS
500.0000 mg | ORAL_TABLET | Freq: Every day | ORAL | Status: AC
  Administered 2012-11-25: 500 mg via ORAL
  Filled 2012-11-25: qty 1

## 2012-11-25 MED ORDER — DEXTROSE 5 % IV SOLN
1.0000 g | INTRAVENOUS | Status: AC
  Administered 2012-11-25 – 2012-11-27 (×3): 1 g via INTRAVENOUS
  Filled 2012-11-25 (×3): qty 10

## 2012-11-25 MED ORDER — QUETIAPINE FUMARATE 25 MG PO TABS
25.0000 mg | ORAL_TABLET | Freq: Two times a day (BID) | ORAL | Status: DC | PRN
  Filled 2012-11-25 (×2): qty 1

## 2012-11-25 MED ORDER — DEXTROSE 5 % IV SOLN: 1.0000 g | INTRAVENOUS | Status: DC

## 2012-11-25 MED ORDER — AZITHROMYCIN 250 MG PO TABS
250.0000 mg | ORAL_TABLET | Freq: Every day | ORAL | Status: DC
  Administered 2012-11-26 – 2012-11-28 (×3): 250 mg via ORAL
  Filled 2012-11-25 (×3): qty 1

## 2012-11-25 MED ORDER — MIDAZOLAM HCL 2 MG/2ML IJ SOLN
1.5000 mg | Freq: Once | INTRAMUSCULAR | Status: AC
Start: 2012-11-25 — End: 2012-11-25
  Administered 2012-11-25: 1.5 mg via INTRAVENOUS
  Filled 2012-11-25: qty 2

## 2012-11-25 NOTE — Progress Notes (Signed)
Family Medicine Teaching Service Daily Progress Note Intern Pager: [phone]  Patient name: Shannon Stokes Medical record number: [id number] Date of birth: 07/13/1916 Age: 79 y.o. Gender: female  Primary Care Provider: Maryjean Ka, MD Consultants: Neurology  Code Status: Full   Pt Overview and Major Events to Date:  8/15 - admitted, concern for acute stroke, +/- infection with delirium overlaping known dementia  Assessment and Plan: Shannon Stokes is a 79 y.o. year old female presenting with left sided weakness with suspect of a stroke. She was found to have left sided weaknes with + babinski on the left foot. She was also found to have a WBC 17. 8 with suspected UTI. She had complaints with burning on urination. Received Rocephin in the ED. PMH is significant for falls and questionable prior CVA per imaging from 3 years ago.   # Likely stroke: found down at home uncertain length of time, so TPA was not initiated - CT showed left cerebellar hemisphere low attenuation, ?age-indeterminate ischemia - Neurology consulted, appreciate recommendations, proceeding with stroke work up.   - 20.1% ASCVD risk, not in statin benefit group due to age   - echo with normal EF, grade 1 diastolic dysfunction, mod MR, mild AR  - carotid dopplers suggest 1-39% bilateral stenosis, ant flow of vertebrals  - A1c 5.8, TSH wnl, lipid panel unremarkable - continue neuro checks regularly - Plavix 75 mg daily, discontinue ASA (pt refusing; if won't take Plavix, can consider ASA 325 daily) - permissive HTN, up to 220/120 for 72 hours post stroke  - CK 405; consider recheck, though doubt frank rhabdo [ ]  f/u further neuro rec's [ ]  F/U MRI/ MRA (one-time dose of Versed ordered for procedural sedation) [ ]  repeat CT if MRI can't be obtained [ ]  F/U PT, OT, SLP rec's  # Possible CAP - CXR 8/15 on review shows patchy opacities possibly slightly worse than prior film, inc WOB 8/16 - concern for delirium on top of  dementia/acute stroke, and with WBC possibility of PNA exists - no frank fever, hypotension, or tachycardia, but mild tachypnea - favor empiric treatment with ceftriaxone and azithromycin [ ]  f/u repeat CXR and abd films 8/16 [ ]  f/u WOB [ ]  f/u AM CBC 8/17  # Abnormal UA - (+) ketones, leuks, Hb, hyaline casts, in setting of WBC 17.8; concern for UTI vs colonization - complained of burning with urination though ROS suspect with dementia/delirium; afebrile on admission  - received one dose of Rocephin in the ED; WBC 14.4 on 8/15 - on abx empirically for possible CAP, as above [ ]  f/u AM labs [ ]  f/u urine culture   #Malnurtrition: albumin low 3.0, prealbumin 12.6 - likely chronic/multifactorial (pt lives alone, cooks and cleans for herself, etc); see social below - consider nutrition c/s   #Dementia: baseline MMSE 16/30 by Dr. Casper Harrison as an outpt within the past couple of months, recently started Aricept -continue Aricept (pt refusing, currently) -pt also currently with component of delirium; continue bedside sitter, attempt to avoid oversedating meds -pt does not appear to have capacity to make her own decisions, currently (see Social, below) -doubt need for psychiatry input at this time, as pt is very disoriented and has an actively involved HCPOA  #HTN: not treated in outpatient setting due to her age and fear of hypotension  -monitor clinically; permissive in setting of acute stroke, regardless  #Social: HCPOA: Dossie Arbour") [place] (family friend) - home [phone], cell [phone] -home health vs SNF considered previously  as an outpt; pt declined and HCPOA unwilling at that time to override her wishes despite MMSE 16/30 and strong concern from [facility] and Dr. Casper Harrison for safety at home -CSW involved, appreciate assistance; HCPOA willing to conduct SNF search -will discuss with HCPOA that pt is definitely unable to make decisions for herself now, and that he is the only person  legally able to make decisions for her, at present  FEN/GI: dysphagia 1 diet, D5-1/2 NS at 125/h Prophylaxis: enoxaparin  Disposition: management as above; will need to clarify HCPOA decision-making and consider GOC discussions  -strongly doubt pt will be able to return home alone at any point  Subjective: Pt seen at bedside. Events overnight noted, agitation, pulling out IV and removing telemetry leads, little change with Haldol. Less confused this morning but still difficult to assess ROS due to confusion. Denies pain other than in her back though repeatedly talks about "needing a ride home."  Objective: Temp:  [98.3 F (36.8 C)-99.6 F (37.6 C)] 99.6 F (37.6 C) (08/16 1100) Pulse Rate:  [81-97] 93 (08/16 1100) Resp:  [18-23] 23 (08/16 1100) BP: (126-172)/(68-104) 156/68 mmHg (08/16 1100) SpO2:  [95 %-99 %] 99 % (08/16 1100) Physical Exam: General: elderly female in NAD, though appears uncomfortable; marked difference in appearance from outpt exams HEENT: EOMI,  left sided facial droop most noticeable at corner of mouth Cardiovascular: S1S2, RRR, no murmur appreciated Respiratory: faint breath sounds throughout, poor inspiratory effort, though WOB slightly increased Abdomen: +BS, soft, non tender, non distended Extremities: strength 3-4/5 on right upper and lower extremities though difficult to judge hand strength (in mitts)  4+/5 left upper extremity and lower extremity , no edema noted Skin: no rash or obvious breakdown noted, though full skin exam not performed Neuro: Alert, but oriented to self only (not time, place, or circumstance); facial droop and extremity weakness as above  Mild dysarthria noted, though question component of delirium in addition to dementia and new deficits  Laboratory:  Recent Labs Lab 11/23/12 2145 11/24/12 0500  WBC 17.8* 14.4*  HGB 14.7 14.2  HCT 43.9 41.9  PLT 166 189    Recent Labs Lab 11/23/12 2145  NA 138  K 4.1  CL 105  CO2 20   BUN 33*  CREATININE 0.86  CALCIUM 9.7  PROT 6.1  BILITOT 0.7  ALKPHOS 70  ALT 10  AST 34  GLUCOSE 107*   Pre-albumin 8/15: 12.6 CK total 8/15: 405 TSH 8/15: 0.876 A1c 8/15: 5.8   Recent Labs Lab 11/23/12 2145  TROPONINI <0.30   Urinalysis    Component Value Date/Time   COLORURINE YELLOW 11/24/2012 0129   APPEARANCEUR CLOUDY* 11/24/2012 0129   LABSPEC 1.025 11/24/2012 0129   PHURINE 5.5 11/24/2012 0129   GLUCOSEU NEGATIVE 11/24/2012 0129   HGBUR MODERATE* 11/24/2012 0129   BILIRUBINUR NEGATIVE 11/24/2012 0129   KETONESUR 15* 11/24/2012 0129   PROTEINUR 100* 11/24/2012 0129   UROBILINOGEN 0.2 11/24/2012 0129   NITRITE NEGATIVE 11/24/2012 0129   LEUKOCYTESUR SMALL* 11/24/2012 0129   Lipid Panel     Component Value Date/Time   CHOL 151 11/24/2012 0500   TRIG 70 11/24/2012 0500   HDL 67 11/24/2012 0500   CHOLHDL 2.3 11/24/2012 0500   VLDL 14 11/24/2012 0500   LDLCALC 70 11/24/2012 0500   MICRO:  Urine culture 8/15 @0129 : PENDING  Imaging/Diagnostic Tests: CXR 8/15: patchy interstitial bilateral opacities, similar to previous films but possibly slightly worse  CT HEAD 8/14 @2220  IMPRESSION:  1. Ill-defined  area of low attenuation in the left cerebellar  hemisphere is new compared to prior studies and may represent age  indeterminate ischemia. This could be further evaluated with MRI  of the brain if clinically indicated.  2. Encephalomalacia in the left occipital region related to an old  infarction.  3. Moderate cerebral and cerebellar atrophy with extensive chronic  microvascular ischemic changes in the cerebral white matter, as  Above.   CT CERVICAL SPINE 8/14 @2220  IMPRESSION:  No evidence of significant acute traumatic injury to the cervical  spine.  8/15: EKG sinus rhythm with sinus arrhythmia with PVC's   2D Echo, 8/15: -Normal LV cavity size, wall thickness, systolic function; EF 55-60%; grade 1 diastolic dysfunction -mild AV regurg -mild MV prolapse of  post leaflet; overall mod mitral regurg -LA mod dilated -peak PA pressure 37 mm Hg  Bobbye Morton, MD 11/25/2012, 12:40 PM PGY-2, [facility] FPTS Intern pager: [phone], text pages welcome

## 2012-11-25 NOTE — Progress Notes (Signed)
PCP Note  Pt seen at bedside. See my prior PCP note 8/15 and my pending progress note for today. I am actively involved with this patient's care while she is admitted. This is a marked change from her previous baseline. There is concern for new stroke, possible acute infection, and Ms Perfetti does not appear as though she will be able to return home to self-care safely. I will discuss all of this with [name], the pt's HCPOA and do my best to help him make any appropriate decisions in the best interest of Ms Poteat.  I appreciate the efforts of the other FPTS providers, nursing, and all other staff in caring for my patient. Please do not hesitate to contact me if I can be of any help. I am available through the FPTS providers (pager [phone]), and my personal contact information per my previous note.  Bobbye Morton, MD PGY-2, [facility] 11/25/2012, 1:16 PM

## 2012-11-25 NOTE — Evaluation (Signed)
Physical Therapy Evaluation Patient Details Name: Shannon Stokes MRN: [id number] DOB: 10-Oct-1916 Today's Date: 11/25/2012 Time: 4098-1191 PT Time Calculation (min): 25 min  PT Assessment / Plan / Recommendation History of Present Illness  79 yo F with baseline dementia found down at home by [facility] with new evidence of L cerebellar stroke on CT and L sided facial droop and weakness.  Clinical Impression  OOB mobility limited by pt with extreme lethargy and difficulty maintaining eye opening. Pt with + BM with mobility, pt dependent for hygiene. Pt did attempt to wash face to cues but has minimal body awareness and proprioception.Pt currently requires assist for all mobility.  Pt will most likely need SNF upon d/c however pending medical improvement may benefit from CIR to maximize functional recovery for safe transition home.    PT Assessment  Patient needs continued PT services    Follow Up Recommendations  SNF;Supervision/Assistance - 24 hour    Does the patient have the potential to tolerate intense rehabilitation      Barriers to Discharge Decreased caregiver support pt lives alone    Equipment Recommendations  None recommended by PT    Recommendations for Other Services     Frequency Min 4X/week    Precautions / Restrictions Precautions Precautions: Fall Restrictions Weight Bearing Restrictions: No   Pertinent Vitals/Pain Pt did not report      Mobility  Bed Mobility Bed Mobility: Supine to Sit;Sit to Supine;Sitting - Scoot to [place] of Bed;Scooting to HOB Supine to Sit: 1: +2 Total assist;HOB flat Supine to Sit: Patient Percentage: 10% Sitting - Scoot to Edge of Bed: 1: +2 Total assist Sitting - Scoot to Edge of Bed: Patient Percentage: 10% Sit to Supine: 1: +2 Total assist;HOB flat Sit to Supine: Patient Percentage: 0% Scooting to HOB: 1: +2 Total assist Scooting to [facility]: Patient Percentage: 0% Details for Bed Mobility Assistance: Pt with minima initiation. attempted  with R UE to use rail Transfers Transfers: Not assessed Ambulation/Gait Ambulation/Gait Assistance: Not tested (comment)    Exercises     PT Diagnosis: Difficulty walking;Generalized weakness  PT Problem List: Decreased strength;Decreased activity tolerance;Decreased balance;Decreased mobility;Decreased coordination;Decreased knowledge of use of DME PT Treatment Interventions: DME instruction;Gait training;Functional mobility training;Therapeutic activities;Therapeutic exercise;Balance training;Neuromuscular re-education     PT Goals(Current goals can be found in the care plan section) Acute Rehab PT Goals Patient Stated Goal: pt did not state PT Goal Formulation: Patient unable to participate in goal setting Time For Goal Achievement: 12/09/12 Potential to Achieve Goals: Fair  Visit Information  Last PT Received On: 11/25/12 Assistance Needed: +2 History of Present Illness: 79 yo F with baseline dementia found down at home by [facility] with new evidence of L cerebellar stroke on CT and L sided facial droop and weakness.       Prior Functioning  Home Living Family/patient expects to be discharged to:: Unsure Additional Comments: pt lived alone Prior Function Level of Independence: Independent Comments: pt poor historian, unable to report PLOF. RN reports pt have been living alone Communication Communication: Expressive difficulties    Cognition  Cognition Arousal/Alertness: Lethargic Behavior During Therapy: Flat affect Overall Cognitive Status: Impaired/Different from baseline Area of Impairment: Orientation;Attention;Following commands;Problem solving Orientation Level: Disoriented to;Place;Time;Situation Current Attention Level: Focused Memory: Decreased short-term memory Following Commands: Follows one step commands inconsistently Problem Solving: Slow processing;Decreased initiation;Difficulty sequencing;Requires verbal cues;Requires tactile cues General Comments: pt  required freq v/c's to maintain eye opening    Extremity/Trunk Assessment Upper Extremity Assessment Upper Extremity Assessment: Difficult  to assess due to impaired cognition Lower Extremity Assessment Lower Extremity Assessment: Difficult to assess due to impaired cognition Cervical / Trunk Assessment Cervical / Trunk Assessment: Normal   Balance Balance Balance Assessed: Yes Static Sitting Balance Static Sitting - Balance Support: Bilateral upper extremity supported;Feet supported Static Sitting - Level of Assistance: 1: +1 Total assist Static Sitting - Comment/# of Minutes: 5 min, pt with + post lean, unable to maintin upright posture. pt with no protective relflex  End of Session PT - End of Session Activity Tolerance: Patient limited by lethargy Patient left: in bed;with call bell/phone within reach Nurse Communication: Mobility status  GP     Marcene Brawn 11/25/2012, 4:08 PM  Wieand Shock, PT, DPT Pager #: [phone] Office #: [phone]

## 2012-11-25 NOTE — Progress Notes (Signed)
Stroke Team Progress Note  HISTORY Shannon Stokes is an 79 y.o. female who is unable to give any history. History obtained from the chart. It seems that the patient was found on the floor by family 11/23/2012 in the evening. At that time left sided weakness was noted. EMS was called and was brought to the hospital 11/23/2012 at 2132. Patient was last seen by family 2 days prior. Patient gives unreliable history but reports that she was on the floor overnight. Patient was not a TPA candidate secondary to unknown time last known well. She was admitted for further evaluation and treatment.  SUBJECTIVE No family is at the bedside.  Appears to be moving LUE and LLE more compared to yesterday's exam.   OBJECTIVE Most recent Vital Signs: Filed Vitals:   11/24/12 2000 11/25/12 0023 11/25/12 0357 11/25/12 0839  BP: 160/91 156/69 156/95 172/104  Pulse: 97 84 88 81  Temp: 98.8 F (37.1 C) 98.8 F (37.1 C) 99.2 F (37.3 C) 98.3 F (36.8 C)  TempSrc: Axillary Axillary Axillary Oral  Resp: 19 18 18 19   Height:      Weight:      SpO2:  95% 95% 97%   CBG (last 3)   Recent Labs  11/23/12 2245  GLUCAP 97    IV Fluid Intake:   . sodium chloride 1,000 mL (11/24/12 1403)    MEDICATIONS  . clopidogrel  75 mg Oral Q breakfast  . donepezil  5 mg Oral QHS  . enoxaparin (LOVENOX) injection  40 mg Subcutaneous Q24H   PRN:  RESOURCE THICKENUP CLEAR, senna-docusate  Diet:  Dysphagia 1 Puree, nectar thick liquids Activity:  Bedrest, OOB Up with assistance DVT Prophylaxis:  Lovenox 40 mg sq daily   CLINICALLY SIGNIFICANT STUDIES Basic Metabolic Panel:   Recent Labs Lab 11/23/12 2145  NA 138  K 4.1  CL 105  CO2 20  GLUCOSE 107*  BUN 33*  CREATININE 0.86  CALCIUM 9.7   Liver Function Tests:   Recent Labs Lab 11/23/12 2145  AST 34  ALT 10  ALKPHOS 70  BILITOT 0.7  PROT 6.1  ALBUMIN 3.0*   CBC:   Recent Labs Lab 11/23/12 2145 11/24/12 0500  WBC 17.8* 14.4*  NEUTROABS 15.4*   --   HGB 14.7 14.2  HCT 43.9 41.9  MCV 86.1 86.6  PLT 166 189   Coagulation:   Recent Labs Lab 11/23/12 2145  LABPROT 15.3*  INR 1.24   Cardiac Enzymes:   Recent Labs Lab 11/23/12 2145 11/24/12 0500  CKTOTAL  --  405*  TROPONINI <0.30  --    Urinalysis:   Recent Labs Lab 11/24/12 0129  COLORURINE YELLOW  LABSPEC 1.025  PHURINE 5.5  GLUCOSEU NEGATIVE  HGBUR MODERATE*  BILIRUBINUR NEGATIVE  KETONESUR 15*  PROTEINUR 100*  UROBILINOGEN 0.2  NITRITE NEGATIVE  LEUKOCYTESUR SMALL*   Lipid Panel    Component Value Date/Time   CHOL 151 11/24/2012 0500   TRIG 70 11/24/2012 0500   HDL 67 11/24/2012 0500   CHOLHDL 2.3 11/24/2012 0500   VLDL 14 11/24/2012 0500   LDLCALC 70 11/24/2012 0500   HgbA1C  Lab Results  Component Value Date   HGBA1C 5.8* 11/24/2012    Urine Drug Screen:     Component Value Date/Time   LABOPIA NONE DETECTED 02/27/2010 1607   COCAINSCRNUR NONE DETECTED 02/27/2010 1607   LABBENZ NONE DETECTED 02/27/2010 1607   AMPHETMU NONE DETECTED 02/27/2010 1607   THCU NONE DETECTED 02/27/2010 1607  LABBARB  Value: NONE DETECTED        DRUG SCREEN FOR MEDICAL PURPOSES ONLY.  IF CONFIRMATION IS NEEDED FOR ANY PURPOSE, NOTIFY LAB WITHIN 5 DAYS.        LOWEST DETECTABLE LIMITS FOR URINE DRUG SCREEN Drug Class       Cutoff (ng/mL) Amphetamine      1000 Barbiturate      200 Benzodiazepine   200 Tricyclics       300 Opiates          300 Cocaine          300 THC              50 02/27/2010 1607    Alcohol Level: No results found for this basename: ETH,  in the last 168 hours  CT Cervical Spine 11/23/2012    No evidence of significant acute traumatic injury to the cervical spine.    CT of the brain  11/23/2012   1.  Ill-defined area of low attenuation in the left cerebellar hemisphere is new compared to prior studies and may represent age indeterminate ischemia.  This could be further evaluated with MRI of the brain if clinically indicated. 2.  Encephalomalacia in the  left occipital region related to an old infarction. 3.  Moderate cerebral and cerebellar atrophy with extensive chronic microvascular ischemic changes in the cerebral white matter  MRI of the brain    MRA of the brain    2D Echocardiogram    Carotid Doppler  No evidence of hemodynamically significant internal carotid artery stenosis. Vertebral artery flow is antegrade.   CXR    EKG  Sinus rhythm with sinus arrhythmia with occasional Premature ventricular complexes  Therapy Recommendations   Physical Exam   Elderly Caucasian lady restless in bed.restrained.  Afebrile. Head is nontraumatic. Neck is supple without bruit.  . Cardiac exam no murmur or gallop. Lungs are clear to auscultation. Distal pulses are well felt Neurological exam ; limited due to patient's significant baseline dementia and lack of cooperation. Awake and alert follows simple midline commands only. Oriented only to self. Diminished attention, registration and recall. Speaks short sentences. Extraocular movements full range. No facial weakness. Tongue midline. Moves all 4 extremities but left-sided less than right. Mild weakness of left grip and hip flexors. Left plantar equivocal right downgoing. .ASSESSMENT [name] is a 79 y.o. female found down with confusion and left sided weakness. Initial imaging confirms a left cerebellar lesion of unknown age, not associated with current symptoms. MRI pending.  Suspect small right brain infarct. Infarct etiology pending.  On aspirin 81 mg orally every day prior to admission. Now on clopidogrel 75 mg orally every day for secondary stroke prevention. Patient with resultant left hemiparesis. Work up underway.  Hypertension Hx stroke Baseline dementia  Hospital day # 2  TREATMENT/PLAN  Continue clopidogrel 75 mg orally every day for secondary stroke prevention, if patient noncompliant or unable to afford plavix, would recommend aspirin 325 mg po daily.  Would like to see  repeat imaging. If possible trial for MRI/MRA which is ordered, if not repeat [facility]  Pt/OT  Speech  Likely SNF at discharge. SW following  Elevated WBC, trend.

## 2012-11-25 NOTE — Progress Notes (Signed)
Clinical Social Work Department BRIEF PSYCHOSOCIAL ASSESSMENT 11/25/2012  Patient:  SHERRYL, VALIDO     Account Number:  [id number]     Admit date:  11/23/2012  Clinical Social Worker:  Hadley Pen  Date/Time:  11/25/2012 04:26 PM  Referred by:  Physician  Date Referred:  11/24/2012 Referred for  SNF Placement   Other Referral:   Interview type:  Patient Other interview type:   Isidoro Donning, [phone] via telephone    PSYCHOSOCIAL DATA Living Status:  FAMILY Admitted from facility:   Level of care:   Primary support name:  Chanetta Marshall [phone] Primary support relationship to patient:  FRIEND Degree of support available:   Unknown    CURRENT CONCERNS Current Concerns  Post-Acute Placement   Other Concerns:    SOCIAL WORK ASSESSMENT / PLAN Weekend CSW received referral for SNF placement. CSW went to speak with patient & patient was unable to communicate. Weekend CSW spoke with pt's HCPOA Jimmy via telephone who was agreeable to CSW faxing out pt info for bed offers to [facility].   Assessment/plan status:  Information/Referral to [facility] Other assessment/ plan:   Information/referral to community resources:   Weekend CSW to fax information for SNF bed offers.    PATIENT'S/FAMILY'S RESPONSE TO PLAN OF CARE: Patient was unresponsive, HCPOA was agreeable to SNF placement.    Samuella Bruin, LCSWA [facility] Emergency Dept. [phone]

## 2012-11-25 NOTE — Progress Notes (Signed)
Weekend CSW spoke with pt's HCPOA Jimmy who was agreeable to CSW faxing out pt info for bed offers.  Samuella Bruin, LCSWA [facility] Emergency Dept. [phone]

## 2012-11-25 NOTE — Progress Notes (Signed)
I have seen and examined this patient. I have discussed with Dr Casper Harrison.  I agree with their findings and plans as documented in their progress note for today.  Acute issues 1. Acute Delirium - Multiple predisposing factors, including advanced age, bedridden, dementia, acute illness.  - Will look for infectious process with UA, CHEST XRAY; R/O fecal impaction with KUB, R/O MI, dehydration -Schedule APAP for possible pain contribution - Restart IV  - Continue Bedside sitter - Attempt to get up to chair twice a day - Remove restraints and Foley cath as soon as medically prudent - OK to use quetiapine low dose for now, if needed.

## 2012-11-25 NOTE — Progress Notes (Signed)
PGY-2/PCP Update Note  Discussed Shannon Stokes' condition with Shannon Stokes, [name] at length over the phone. Clarified pt's code status - pt and Shannon Stokes have in the past discussed some end of life issues, and pt has expressed in the past that she 1) would NOT want to live in a persistent vegetative state and 2) would NOT want heroic efforts at resuscitation if such efforts were difficult or prolonged and would result in significant discomfort or injury (i.e., pt is a "full code" in that she would desire all appropriate measures such as CPR, intubation, medications, etc, but would not want, in Mr. Shannon Stokes words, "being worked on hard for an hour if she's not coming back"). Will discuss this with pt's nursing and other FPTS so providers are aware that if she does require resuscitative efforts, a code blue situation would, by pt's wishes, not need to be prolonged or excessive in duration.  Also discussed pt's condition otherwise; pt remains intermittently confused and with neurologic deficits that limit her ability to make her own decisions and her ability to care for herself. Shannon Stokes expresses that there is a SNF in [place] that he would potentially be interested in, and he states he will discuss further with social worker(s) about bed searching. Also explained that pt is currently on empiric antibiotics to cover possible pneumonia and/or UTI, and that while pt has had some intermittent need for mechanical restraint, that we will limit this as much as possible.  Lastly, while Shannon Stokes' current status is such that she clearly cannot make her own decisions or care for herself, it is not certain that she will not improve; it is possible that she may return to her previous mental baseline with some residual physical weakness, and/or that her physical weakness may improve. However, it is also possible that she may continue to decline and/or require more aggressive measures for treatment and/or  diagnosis. This was discussed with Shannon Stokes as well, and I stated to him that while we are not definitely in the situation of needing to address full palliative/comfort care measures, that if she continues to decline, that such measures are an alternative to aggressive care. Shannon Stokes stated that he would be open to discussions about comfort/Hospice-style care if necessary, and would potentially be open to discussions with the FPTS providers and/or the palliative care consult team. He expressed his gratitude for the care and consideration Shannon Stokes is being given and had no further questions for me at this time.  This note is cosigned to attending Dr. McDiarmid for his awareness, as he and I discussed much of the above earlier today, as well.  Bobbye Morton, MD PGY-2, [facility] 11/25/2012, 4:19 PM FPTS Service pager: [phone] (text pages welcome through [facility])

## 2012-11-25 NOTE — Progress Notes (Addendum)
Clinical Social Work Department CLINICAL SOCIAL WORK PLACEMENT NOTE 11/25/2012  Patient:  Shannon Stokes, Shannon Stokes  Account Number:  [id number] Admit date:  11/23/2012  Clinical Social Worker:  Doree Albee  Date/time:  11/25/2012 03:06 PM  Clinical Social Work is seeking post-discharge placement for this patient at the following level of care:   SKILLED NURSING   (*CSW will update this form in Epic as items are completed)   11/25/2012  Patient/family provided with [facility] Department of Clinical Social Work's list of facilities offering this level of care within the geographic area requested by the patient (or if unable, by the patient's family).  11/25/2012  Patient/family informed of their freedom to choose among providers that offer the needed level of care, that participate in Medicare, Medicaid or managed care program needed by the patient, have an available bed and are willing to accept the patient.  11/25/2012  Patient/family informed of [facility]' ownership interest in [facility], as well as of the fact that they are under no obligation to receive care at this facility.  PASARR submitted to EDS on 11/25/2012 PASARR number received from EDS on 11/25/2012  FL2 transmitted to all facilities in geographic area requested by pt/family on  11/25/2012 FL2 transmitted to all facilities within larger geographic area on   Patient informed that his/her managed care company has contracts with or will negotiate with  certain facilities, including the following:     Patient/family informed of bed offers received: 11/27/2012  Patient chooses bed at  Physician recommends and patient chooses bed at    Patient to be transferred to  on   Patient to be transferred to facility by   The following physician request were entered in Epic:   Additional Comments:  .Catha Gosselin, LCSWA weekend covering CSW .11/25/2012 1707pm

## 2012-11-26 DIAGNOSIS — I480 Paroxysmal atrial fibrillation: Secondary | ICD-10-CM | POA: Clinically undetermined

## 2012-11-26 DIAGNOSIS — I4891 Unspecified atrial fibrillation: Secondary | ICD-10-CM | POA: Diagnosis not present

## 2012-11-26 LAB — CBC
Hemoglobin: 13 g/dL (ref 12.0–15.0)
MCH: 29.6 pg (ref 26.0–34.0)
MCHC: 34.3 g/dL (ref 30.0–36.0)
Platelets: 172 10*3/uL (ref 150–400)
RDW: 13.9 % (ref 11.5–15.5)

## 2012-11-26 LAB — BASIC METABOLIC PANEL
Calcium: 9.2 mg/dL (ref 8.4–10.5)
GFR calc Af Amer: 70 mL/min — ABNORMAL LOW (ref >90)
GFR calc non Af Amer: 60 mL/min — ABNORMAL LOW (ref >90)
Potassium: 3.9 mEq/L (ref 3.5–5.1)
Sodium: 138 mEq/L (ref 135–145)

## 2012-11-26 LAB — TSH: TSH: 1.268 u[IU]/mL (ref 0.350–4.500)

## 2012-11-26 LAB — TROPONIN I: Troponin I: <0.3 ng/mL (ref <0.30)

## 2012-11-26 MED ORDER — METOPROLOL TARTRATE 1 MG/ML IV SOLN
5.0000 mg | Freq: Four times a day (QID) | INTRAVENOUS | Status: DC
  Administered 2012-11-26 – 2012-11-27 (×2): 5 mg via INTRAVENOUS
  Filled 2012-11-26 (×6): qty 5

## 2012-11-26 MED ORDER — DILTIAZEM HCL 30 MG PO TABS
30.0000 mg | ORAL_TABLET | Freq: Once | ORAL | Status: AC
  Administered 2012-11-26: 30 mg via ORAL
  Filled 2012-11-26: qty 1

## 2012-11-26 MED ORDER — APIXABAN 5 MG PO TABS
5.0000 mg | ORAL_TABLET | Freq: Two times a day (BID) | ORAL | Status: DC
  Administered 2012-11-26 – 2012-11-28 (×4): 5 mg via ORAL
  Filled 2012-11-26 (×5): qty 1

## 2012-11-26 MED ORDER — METOPROLOL TARTRATE 1 MG/ML IV SOLN: 5.0000 mg | Freq: Four times a day (QID) | INTRAVENOUS | Status: DC

## 2012-11-26 MED ORDER — METOPROLOL TARTRATE 1 MG/ML IV SOLN
5.0000 mg | INTRAVENOUS | Status: AC
  Administered 2012-11-26: 5 mg via INTRAVENOUS
  Filled 2012-11-26: qty 5

## 2012-11-26 NOTE — Progress Notes (Signed)
ANTICOAGULATION CONSULT NOTE - Initial Consult  Pharmacy Consult for Apixaban Indication: nonvalvular atrial fibrillation / CVA prophylaxis  No Known Allergies  Patient Measurements: Height: 5\' 4"  (162.6 cm) Weight: 150 lb 6.4 oz (68.221 kg) IBW/kg (Calculated) : 54.7   Vital Signs: Temp: 98.1 F (36.7 C) (08/17 1609) Temp src: Axillary (08/17 1510) BP: 142/86 mmHg (08/17 1720) Pulse Rate: 107 (08/17 1720)  Labs:  Recent Labs  11/23/12 2145 11/24/12 0500 11/25/12 1307 11/26/12 0755 11/26/12 1209  HGB 14.7 14.2  --  13.0  --   HCT 43.9 41.9  --  37.9  --   PLT 166 189  --  172  --   APTT 28  --   --   --   --   LABPROT 15.3*  --   --   --   --   INR 1.24  --   --   --   --   CREATININE 0.86  --   --  0.81  --   CKTOTAL  --  405*  --   --   --   TROPONINI <0.30  --  <0.30  --  <0.30    Estimated Creatinine Clearance: 40.3 ml/min (by C-G formula based on Cr of 0.81).   Medical History: Past Medical History  Diagnosis Date  . Hypertension   . Stroke       Assessment: 79 y.o female with nonvalvular atrial fibillation with RVR to start on Apixaban for stroke prophylaxis. Plavix discontinued.   SCr = 0.81, CrCl ~ 40 ml/min.  Weight = 68.2 kg.  No bleeding noted. PLTC 172K,  H/H 13/37.9 today INR 1.24 (11/23/12)  Goal of Therapy:  CVA prophylaxis Monitor platelets by anticoagulation protocol: Yes   Plan:  Apixaban 5 mg po BID Discontinued Lovenox 40mg  q24h(DVT prophylaxis).  Noah Delaine, RPh Clinical Pharmacist Pager: [phone] 11/26/2012,6:39 PM

## 2012-11-26 NOTE — Progress Notes (Addendum)
I have seen and examined this patient. I have discussed with Dr Casper Harrison.  I agree with their findings and plans as documented in their progress note.   Acute issue  Tachycardia - Telemetry shows narrow complex tachycardia rate 140 to 160 bpm without P waves before the QRS c/w Atrial Fibrillation with RVR - Recent Echocardiogram did not show valvular disease.  - Normal Recent TSH - Troponin I from yesterday (prior to tachycardia) were WNL - Diagnosis: Atrial Fibrillation with RVR, patient hemodynamically stable.  - Recommendations: - Metoprolol 5 mg IV every 6 hours with goal HR < 110 bpm - Start Apixaban per pharmacy for stroke prophylaxis. Stop plavix.  - Continue treatment for possible pneumonia.

## 2012-11-26 NOTE — Progress Notes (Signed)
Family Medicine Teaching Service Daily Progress Note Intern Pager: [phone]  Patient name: Shannon Stokes Medical record number: [id number] Date of birth: 30-Jul-1916 Age: 79 y.o. Gender: female  Primary Care Provider: Maryjean Ka, MD Consultants: Neurology  Code Status: Full   Pt Overview and Major Events to Date:  8/15 - admitted, concern for acute stroke, +/- infection with delirium overlaping known dementia 8/16 - right pons acute stroke on MRI, left vertebral artery occlusion on MRA  Assessment and Plan: Shannon Stokes is a 79 y.o. year old female presenting with left sided weakness suspicious for stroke confirmed on MRI 8/16. She was found to have left sided weaknes with + babinski on the left foot. She was also found to have a WBC 17. 8 with suspected UTI. She had complaints with burning on urination. Received Rocephin in the ED. PMH is significant for falls and questionable prior CVA per imaging from 3 years ago.   # Acute stroke in pons: found down at home uncertain length of time, so TPA was not initiated - CT showed left cerebellar hemisphere low attenuation, ?age-indeterminate ischemia - Neurology consulted, appreciate recommendations, proceeding with stroke work up.   - 20.1% ASCVD risk, not in statin benefit group due to age   - echo with normal EF, grade 1 diastolic dysfunction, mod MR, mild AR  - carotid dopplers suggest 1-39% bilateral stenosis, ant flow of vertebrals  - A1c 5.8, TSH wnl, lipid panel unremarkable  - MRI shows 12 mm acute infarct in right pons with extensive global chronic ischemic changes  - MRA neck shows left vertebral occlusion, retrograde PICA flow, intracranial carotid siphon stenoses - continue neuro checks regularly - Plavix 75 mg daily, discontinue ASA (pt refusing; if won't take Plavix, can consider ASA 325 daily) - permissive HTN, up to 220/120 for 72 hours post stroke  - CK 405; consider recheck, though doubt frank rhabdo [ ]  f/u further  neuro rec's [ ]  F/U PT, OT, SLP rec's  # Delirium - waxing/waning, possible infectious component as below - somnolence 8/17 possibly exacerbated by Versed for MRI on 8/16 - Seroquel BID PRN, avoid sedating meds - soft wrist restraints to be ordered daily, though will avoid as much as possible - continue sitter - see also other issues, below (esp dementia)  # Possible CAP - CXR 8/15 on review shows patchy opacities possibly slightly worse than prior film, inc WOB 8/16 - concern for delirium on top of dementia/acute stroke, and with WBC possibility of PNA exists - no frank fever, hypotension, or tachycardia, but mild tachypnea - repeat CXR 8/16 suggests chronic opacity, not acute; WBC down to 10.1 8/17 - continue empiric treatment with ceftriaxone and azithromycin [ ]  f/u WOB  # Abnormal UA - (+) ketones, leuks, Hb, hyaline casts, in setting of WBC 17.8; concern for UTI vs colonization - complained of burning with urination though ROS suspect with dementia/delirium; afebrile on admission  - received one dose of Rocephin in the ED; WBC 14.4 on 8/15 - on abx empirically for possible CAP, as above [ ]  f/u urine culture   #Malnurtrition: albumin low 3.0, prealbumin 12.6 - likely chronic/multifactorial (pt lives alone, cooks and cleans for herself, etc); see social below - consider nutrition c/s   #Dementia: baseline MMSE 16/30 by Dr. Casper Harrison as an outpt within the past couple of months, recently started Aricept -continue Aricept (pt refusing, currently) -pt also currently with component of delirium; continue bedside sitter, attempt to avoid oversedating meds -pt does not  appear to have capacity to make her own decisions, currently (see Social, below) -doubt need for psychiatry input at this time, as pt is very disoriented and has an actively involved HCPOA  #HTN: not treated in outpatient setting due to her age and fear of hypotension; generally 150's-170's/60's-80's -monitor clinically;  permissive in setting of acute stroke, regardless  #Chronic arthritic pain - scheduled Tylenol  #Social: HCPOA: Dossie Arbour") [place] (family friend) - home [phone], cell [phone] -see progress notes from Dr. Casper Harrison 8/16; pt is limited code (all measures but not prolonged or heroic efforts) -CSW involved, appreciate assistance; HCPOA willing to conduct SNF search  FEN/GI: dysphagia 1 diet, D5-1/2 NS at 125/h Prophylaxis: enoxaparin  Disposition: management as above; strongly doubt pt will be able to return home alone at any point  Subjective: Pt seen at bedside. Less agitated overnight though received Versed for MRI last evening. Rousable and states she is feeling "okay" without specific complaint. ROS difficult to obtain secondary to mental status.  Objective: Temp:  [98.2 F (36.8 C)-99.8 F (37.7 C)] 98.2 F (36.8 C) (08/17 0800) Pulse Rate:  [73-93] 73 (08/17 0800) Resp:  [18-23] 18 (08/17 0800) BP: (156-178)/(57-104) 178/62 mmHg (08/17 0800) SpO2:  [96 %-99 %] 99 % (08/17 0800) Physical Exam: General: elderly female in NAD, though appears uncomfortable; marked difference in appearance from outpt exams  Somnolent but rousable, answers some questions appropriately HEENT: EOMI,  left sided facial droop most noticeable at corner of mouth Cardiovascular: S1S2, RRR, no murmur appreciated Respiratory: coarse breath sounds throughout, WOB remains slightly increased Abdomen: +BS, soft, non tender, non distended Extremities: generally unchanged from 8/16, left-sided weakness remains  strength 3-4/5 on right upper and lower extremities though difficult to judge hand strength (in mitts)  4+/5 left upper extremity and lower extremity , no edema noted Skin: no rash or obvious breakdown noted, though full skin exam not performed Neuro: facial droop and extremity weakness as above; orientation not specifically addressed 8/17  Mild dysarthria noted, component of delirium in  addition to dementia and new deficits  Laboratory:  Recent Labs Lab 11/23/12 2145 11/24/12 0500  WBC 17.8* 14.4*  HGB 14.7 14.2  HCT 43.9 41.9  PLT 166 189    Recent Labs Lab 11/23/12 2145  NA 138  K 4.1  CL 105  CO2 20  BUN 33*  CREATININE 0.86  CALCIUM 9.7  PROT 6.1  BILITOT 0.7  ALKPHOS 70  ALT 10  AST 34  GLUCOSE 107*   Pre-albumin 8/15: 12.6 CK total 8/15: 405 TSH 8/15: 0.876 A1c 8/15: 5.8   Recent Labs Lab 11/23/12 2145 11/25/12 1307  TROPONINI <0.30 <0.30   Urinalysis    Component Value Date/Time   COLORURINE YELLOW 11/25/2012 1521   APPEARANCEUR CLEAR 11/25/2012 1521   LABSPEC 1.023 11/25/2012 1521   PHURINE 5.5 11/25/2012 1521   GLUCOSEU NEGATIVE 11/25/2012 1521   HGBUR SMALL* 11/25/2012 1521   BILIRUBINUR NEGATIVE 11/25/2012 1521   KETONESUR 40* 11/25/2012 1521   PROTEINUR 30* 11/25/2012 1521   UROBILINOGEN 1.0 11/25/2012 1521   NITRITE POSITIVE* 11/25/2012 1521   LEUKOCYTESUR NEGATIVE 11/25/2012 1521   Lipid Panel     Component Value Date/Time   CHOL 151 11/24/2012 0500   TRIG 70 11/24/2012 0500   HDL 67 11/24/2012 0500   CHOLHDL 2.3 11/24/2012 0500   VLDL 14 11/24/2012 0500   LDLCALC 70 11/24/2012 0500   MICRO:  Urine culture 8/15 @0129 : PENDING  Imaging/Diagnostic Tests: CXR 8/15: patchy interstitial bilateral  opacities, similar to previous films but possibly slightly worse  CT HEAD 8/14 @2220  IMPRESSION:  1. Ill-defined area of low attenuation in the left cerebellar  hemisphere is new compared to prior studies and may represent age  indeterminate ischemia. This could be further evaluated with MRI  of the brain if clinically indicated.  2. Encephalomalacia in the left occipital region related to an old  infarction.  3. Moderate cerebral and cerebellar atrophy with extensive chronic  microvascular ischemic changes in the cerebral white matter, as  Above.   CT CERVICAL SPINE 8/14 @2220  IMPRESSION:  No evidence of significant acute  traumatic injury to the cervical  spine.  8/15: EKG sinus rhythm with sinus arrhythmia with PVC's   2D Echo, 8/15: -Normal LV cavity size, wall thickness, systolic function; EF 55-60%; grade 1 diastolic dysfunction -mild AV regurg -mild MV prolapse of post leaflet; overall mod mitral regurg -LA mod dilated -peak PA pressure 37 mm Hg  Bobbye Morton, MD 11/26/2012, 8:37 AM PGY-2, [facility] FPTS Intern pager: [phone], text pages welcome

## 2012-11-26 NOTE — Progress Notes (Signed)
Stroke Team Progress Note  HISTORY Shannon Stokes is an 79 y.o. female who is unable to give any history. History obtained from the chart. It seems that the patient was found on the floor by family 11/23/2012 in the evening. At that time left sided weakness was noted. EMS was called and was brought to the hospital 11/23/2012 at 2132. Patient was last seen by family 2 days prior. Patient gives unreliable history but reports that she was on the floor overnight. Patient was not a TPA candidate secondary to unknown time last known well. She was admitted for further evaluation and treatment.  SUBJECTIVE No family is at the bedside.  Appears to be moving LUE and LLE more compared to yesterday's exam.  Mental status slightly improved. S/p MRI brain   OBJECTIVE Most recent Vital Signs: Filed Vitals:   11/25/12 1644 11/25/12 2109 11/26/12 0536 11/26/12 0800  BP: 158/57 160/66 175/62 178/62  Pulse: 85 88 78 73  Temp: 99.8 F (37.7 C) 98.4 F (36.9 C) 98.7 F (37.1 C) 98.2 F (36.8 C)  TempSrc: Axillary Axillary Axillary Axillary  Resp: 22 20  18   Height:      Weight:      SpO2: 96% 96% 99% 99%   CBG (last 3)   Recent Labs  11/23/12 2245 11/25/12 1640  GLUCAP 97 98    IV Fluid Intake:   . sodium chloride 1,000 mL (11/24/12 1403)  . dextrose 5 % and 0.45 % NaCl with KCl 20 mEq/L 100 mL/hr at 11/26/12 0153    MEDICATIONS  . acetaminophen  650 mg Oral Q6H  . azithromycin  250 mg Oral Daily  . cefTRIAXone (ROCEPHIN)  IV  1 g Intravenous Q24H  . clopidogrel  75 mg Oral Q breakfast  . donepezil  5 mg Oral QHS  . enoxaparin (LOVENOX) injection  40 mg Subcutaneous Q24H   PRN:  QUEtiapine, RESOURCE THICKENUP CLEAR, senna-docusate  Diet:  Dysphagia 1 Puree, nectar thick liquids Activity:  Bedrest, OOB Up with assistance DVT Prophylaxis:  Lovenox 40 mg sq daily   CLINICALLY SIGNIFICANT STUDIES Basic Metabolic Panel:   Recent Labs Lab 11/23/12 2145 11/26/12 0755  NA 138 138  K 4.1 3.9   CL 105 108  CO2 20 22  GLUCOSE 107* 141*  BUN 33* 13  CREATININE 0.86 0.81  CALCIUM 9.7 9.2   Liver Function Tests:   Recent Labs Lab 11/23/12 2145  AST 34  ALT 10  ALKPHOS 70  BILITOT 0.7  PROT 6.1  ALBUMIN 3.0*   CBC:   Recent Labs Lab 11/23/12 2145 11/24/12 0500 11/26/12 0755  WBC 17.8* 14.4* 10.1  NEUTROABS 15.4*  --   --   HGB 14.7 14.2 13.0  HCT 43.9 41.9 37.9  MCV 86.1 86.6 86.3  PLT 166 189 172   Coagulation:   Recent Labs Lab 11/23/12 2145  LABPROT 15.3*  INR 1.24   Cardiac Enzymes:   Recent Labs Lab 11/23/12 2145 11/24/12 0500 11/25/12 1307  CKTOTAL  --  405*  --   TROPONINI <0.30  --  <0.30   Urinalysis:   Recent Labs Lab 11/24/12 0129 11/25/12 1521  COLORURINE YELLOW YELLOW  LABSPEC 1.025 1.023  PHURINE 5.5 5.5  GLUCOSEU NEGATIVE NEGATIVE  HGBUR MODERATE* SMALL*  BILIRUBINUR NEGATIVE NEGATIVE  KETONESUR 15* 40*  PROTEINUR 100* 30*  UROBILINOGEN 0.2 1.0  NITRITE NEGATIVE POSITIVE*  LEUKOCYTESUR SMALL* NEGATIVE   Lipid Panel    Component Value Date/Time   CHOL 151 11/24/2012  0500   TRIG 70 11/24/2012 0500   HDL 67 11/24/2012 0500   CHOLHDL 2.3 11/24/2012 0500   VLDL 14 11/24/2012 0500   LDLCALC 70 11/24/2012 0500   HgbA1C  Lab Results  Component Value Date   HGBA1C 5.8* 11/24/2012    Urine Drug Screen:     Component Value Date/Time   LABOPIA NONE DETECTED 02/27/2010 1607   COCAINSCRNUR NONE DETECTED 02/27/2010 1607   LABBENZ NONE DETECTED 02/27/2010 1607   AMPHETMU NONE DETECTED 02/27/2010 1607   THCU NONE DETECTED 02/27/2010 1607   LABBARB  Value: NONE DETECTED        DRUG SCREEN FOR MEDICAL PURPOSES ONLY.  IF CONFIRMATION IS NEEDED FOR ANY PURPOSE, NOTIFY LAB WITHIN 5 DAYS.        LOWEST DETECTABLE LIMITS FOR URINE DRUG SCREEN Drug Class       Cutoff (ng/mL) Amphetamine      1000 Barbiturate      200 Benzodiazepine   200 Tricyclics       300 Opiates          300 Cocaine          300 THC              50 02/27/2010 1607     Alcohol Level: No results found for this basename: ETH,  in the last 168 hours  CT Cervical Spine 11/23/2012    No evidence of significant acute traumatic injury to the cervical spine.    CT of the brain  11/23/2012   1.  Ill-defined area of low attenuation in the left cerebellar hemisphere is new compared to prior studies and may represent age indeterminate ischemia.  This could be further evaluated with MRI of the brain if clinically indicated. 2.  Encephalomalacia in the left occipital region related to an old infarction. 3.  Moderate cerebral and cerebellar atrophy with extensive chronic microvascular ischemic changes in the cerebral white matter  MRI of the brain  12 mm acute infarction within the right pons. No evidence of acute cerebellar infarct.    MRA of the brain  The left vertebral artery is occluded. There is retrograde flow to  pica. This finding is new since the study of 2011   2D Echocardiogram  55% to 60%. Doppler parameters are consistent with abnormal left ventricular relaxation (grade 1 diastolic dysfunction).    Carotid Doppler  No evidence of hemodynamically significant internal carotid artery stenosis. Vertebral artery flow is antegrade.   CXR    EKG  Sinus rhythm with sinus arrhythmia with occasional Premature ventricular complexes  Therapy Recommendations   Physical Exam   Elderly Caucasian lady restless in bed.restrained.  Afebrile. Head is nontraumatic. Neck is supple without bruit.  . Cardiac exam no murmur or gallop. Lungs are clear to auscultation. Distal pulses are well felt Neurological exam ; limited due to patient's significant baseline dementia and lack of cooperation. Awake and alert follows simple midline commands only. Oriented only to self. Diminished attention, registration and recall. Speaks short sentences. Extraocular movements full range. No facial weakness. Tongue midline. Moves all 4 extremities but left-sided less than right. Mild  weakness of left grip and hip flexors. Left plantar equivocal right downgoing. .ASSESSMENT [name] is a 79 y.o. female found down with confusion and left sided weakness. Initial imaging confirms a left cerebellar lesion of unknown age, not associated with current symptoms. MRI pending.  Suspect small right brain infarct. Infarct etiology pending.  On aspirin 81 mg  orally every day prior to admission. Now on clopidogrel 75 mg orally every day for secondary stroke prevention. Patient with resultant left hemiparesis. MRI brain consistent with R pontine ischemic infarct.   Hypertension Hx stroke Baseline dementia  Hospital day # 3  TREATMENT/PLAN  Continue clopidogrel 75 mg orally every day for secondary stroke prevention, if patient noncompliant or unable to afford plavix, would recommend aspirin 325 mg po daily.  MRI brain R pontine infarct. She is R vert dominant. L vertebral occlusion not contributing to current stroke.   Pt/OT  Speech  Please obtain hemoglobin A1c  Likely SNF at discharge. SW following  WBC improving being treated for CAP

## 2012-11-27 DIAGNOSIS — R29818 Other symptoms and signs involving the nervous system: Secondary | ICD-10-CM

## 2012-11-27 DIAGNOSIS — I4891 Unspecified atrial fibrillation: Secondary | ICD-10-CM

## 2012-11-27 DIAGNOSIS — I6350 Cerebral infarction due to unspecified occlusion or stenosis of unspecified cerebral artery: Secondary | ICD-10-CM

## 2012-11-27 LAB — CBC
Hemoglobin: 13.1 g/dL (ref 12.0–15.0)
MCHC: 33.9 g/dL (ref 30.0–36.0)
RBC: 4.45 MIL/uL (ref 3.87–5.11)
WBC: 10.2 10*3/uL (ref 4.0–10.5)

## 2012-11-27 MED ORDER — POTASSIUM CHLORIDE IN NACL 20-0.9 MEQ/L-% IV SOLN
INTRAVENOUS | Status: DC
  Administered 2012-11-27 – 2012-11-28 (×3): via INTRAVENOUS
  Filled 2012-11-27 (×3): qty 1000

## 2012-11-27 MED ORDER — METOPROLOL TARTRATE 12.5 MG HALF TABLET
12.5000 mg | ORAL_TABLET | Freq: Two times a day (BID) | ORAL | Status: DC
  Administered 2012-11-27 – 2012-11-28 (×3): 12.5 mg via ORAL
  Filled 2012-11-27 (×4): qty 1

## 2012-11-27 NOTE — Evaluation (Signed)
Occupational Therapy Evaluation Patient Details Name: Shannon Stokes MRN: [id number] DOB: 1916/05/18 Today's Date: 11/27/2012 Time: 0865-7846 OT Time Calculation (min): 33 min  OT Assessment / Plan / Recommendation History of present illness 79 yo F with baseline dementia found down at home by [facility] with new evidence of L cerebellar stroke on CT and L sided facial droop and weakness.   Clinical Impression   Pt presents with below problem list. Pt will benefit from acute OT to increase independence prior to d/c. Pt lived alone, PTA. Recommending SNF for d/c as pt needs 24/7 assistance and supervision.      OT Assessment  Patient needs continued OT Services    Follow Up Recommendations  SNF;Supervision/Assistance - 24 hour    Barriers to Discharge      Equipment Recommendations  Other (comment) (tbd)    Recommendations for Other Services    Frequency  Min 2X/week    Precautions / Restrictions Precautions Precautions: Fall Precaution Comments: left sided inattention, Left and posterior lean Restrictions Weight Bearing Restrictions: No   Pertinent Vitals/Pain No pain reported.     ADL  Grooming: Performed;Wash/dry face;Brushing hair;Moderate assistance;Supervision/safety;Set up;Maximal assistance Where Assessed - Grooming: Supine, head of bed up;Supported sitting (brushed hair-sitting EOB and washed face-supine in bed) Upper Body Bathing: Moderate assistance Where Assessed - Upper Body Bathing: Supported sitting Lower Body Bathing: +2 Total assistance Lower Body Bathing: Patient Percentage: 10% Where Assessed - Lower Body Bathing: Supported sitting Upper Body Dressing: Moderate assistance Where Assessed - Upper Body Dressing: Supported sitting Lower Body Dressing: +2 Total assistance Lower Body Dressing: Patient Percentage: 10% Where Assessed - Lower Body Dressing: Supported sit to stand Toilet Transfer: Simulated;+2 Total assistance Toilet Transfer: Patient Percentage:  50% Statistician Method: Surveyor, minerals: Other (comment) (from elevated bed to recliner chair) Toileting - Clothing Manipulation and Hygiene: +2 Total assistance Toileting - Clothing Manipulation and Hygiene: Patient Percentage: 0% Where Assessed - Toileting Clothing Manipulation and Hygiene: Sit to stand from 3-in-1 or toilet Tub/Shower Transfer Method: Not assessed Equipment Used: Gait belt Transfers/Ambulation Related to ADLs: +2 total A (50%) for transfers. ADL Comments: Pt incontinent of bowel and was Total A for hygiene. Pt washed face while supine in bed. Assistance to get glasses back on face correctly. Pt brushed hair sitting on EOB requiring Mod/Max A for balance and continually brushed right side of hair. Verbal and tactile cues given to brush left side of hair. OT also provided [facility] assist with task.  Pt able to doff socks, but required assistance to don.     OT Diagnosis: Generalized weakness;Cognitive deficits  OT Problem List: Decreased strength;Decreased activity tolerance;Impaired balance (sitting and/or standing);Decreased safety awareness;Decreased cognition;Decreased knowledge of use of DME or AE;Decreased knowledge of precautions OT Treatment Interventions: Self-care/ADL training;DME and/or AE instruction;Therapeutic exercise;Therapeutic activities;Patient/family education;Balance training;Cognitive remediation/compensation;Visual/perceptual remediation/compensation   OT Goals(Current goals can be found in the care plan section) Acute Rehab OT Goals Patient Stated Goal: Unable to state due to cognition OT Goal Formulation: Patient unable to participate in goal setting Time For Goal Achievement: 12/04/12 Potential to Achieve Goals: Fair ADL Goals Pt Will Perform Grooming: with min assist;sitting Pt Will Perform Upper Body Dressing: with min assist;sitting Pt Will Perform Lower Body Dressing: sit to/from stand;with mod assist Pt Will Transfer to  Toilet: with max assist;stand pivot transfer;bedside commode Pt Will Perform Toileting - Clothing Manipulation and hygiene: with max assist;sit to/from stand Additional ADL Goal #1: Pt will perform bed mobility at Mod A level  as precursor for EOB ADLs. Additional ADL Goal #2: Pt will perform functional tasks sitting EOB with Min A for sitting balance.   Visit Information  Last OT Received On: 11/27/12 Assistance Needed: +2 PT/OT Co-Evaluation/Treatment: Yes History of Present Illness: 79 yo F with baseline dementia found down at home by [facility] with new evidence of L cerebellar stroke on CT and L sided facial droop and weakness.       Prior Functioning     Home Living Family/patient expects to be discharged to:: Unsure Additional Comments: pt lived alone Prior Function Level of Independence: Independent Comments: per PT, pt poor historian, unable to report PLOF. RN reports pt have been living alone Communication Communication: No difficulties         Vision/Perception     Cognition  Cognition Arousal/Alertness: Awake/alert Behavior During Therapy: WFL for tasks assessed/performed Overall Cognitive Status: Impaired/Different from baseline Area of Impairment: Orientation;Attention;Memory;Safety/judgement;Awareness;Problem solving Orientation Level: Disoriented to;Person;Place;Time;Situation Current Attention Level: Focused Memory: Decreased short-term memory (decreased memory in general, unable to state year of birth) Following Commands: Follows one step commands consistently Safety/Judgement: Decreased awareness of safety;Decreased awareness of deficits Awareness: Intellectual Problem Solving: Slow processing;Difficulty sequencing;Requires verbal cues;Requires tactile cues General Comments: Pt needed cue to maintain focused attention and to attend to left side.  She was unable to recall her DOB and she could not tell PT if her husband was still alive (he is not)      Extremity/Trunk Assessment Upper Extremity Assessment Upper Extremity Assessment: Difficult to assess due to impaired cognition (left grip strength is weaker)     Mobility Bed Mobility Bed Mobility: Supine to Sit;Sitting - Scoot to Edge of Bed Supine to Sit: 1: +2 Total assist;With rails;HOB elevated Supine to Sit: Patient Percentage: 50% Sitting - Scoot to Edge of Bed: 1: +2 Total assist;With rail Sitting - Scoot to [place] of Bed: Patient Percentage: 10% Details for Bed Mobility Assistance: Pt was able to initiate movement of bil legs to EOB, but was unable to fully complete without assist.  Bil hand held assist and support at trunk to pull to sitting.  Assist to scoot at trunk and weight shifting hips due to decreased sitting balance.   Transfers Transfers: Sit to Stand;Stand to Sit Sit to Stand: 1: +2 Total assist;From elevated surface;With upper extremity assist;From bed Sit to Stand: Patient Percentage: 50% Stand to Sit: 1: +2 Total assist;With upper extremity assist;To chair/3-in-1;With armrests Stand to Sit: Patient Percentage: 50% Details for Transfer Assistance: pt leaning to the left during standing and transfers.  She was able to take good weight on bil feet, but was unable to stand fully upright.  Pt reaching for bed rail and then arm rest in chair for support during transfer.  Support given at trunk for balance and at hips for upright posture.       Exercise     Balance Balance Balance Assessed: Yes Static Sitting Balance Static Sitting - Balance Support: Bilateral upper extremity supported;Feet supported Static Sitting - Level of Assistance: 3: Mod assist;2: Max assist Static Sitting - Comment/# of Minutes: mod to max assist to maintain sitting balance while working with OT on grooming activities. Pt with left and posterior lean in sitting. Pt is unaware of balance deficits and does not self correct without max verbal and manual assist   End of Session OT - End of  Session Equipment Utilized During Treatment: Gait belt Activity Tolerance: Patient tolerated treatment well Patient left: in chair;with call bell/phone within reach;with  chair alarm set Nurse Communication: Other (comment) (nurse came in during session-assisted with peri care)  GO     Earlie Raveling OTR/L [phone] 11/27/2012, 5:59 PM

## 2012-11-27 NOTE — Progress Notes (Signed)
Family Medicine Teaching Service Daily Progress Note Intern Pager: [phone]  Patient name: Shannon Stokes Medical record number: [id number] Date of birth: [birth year] Age: 79 y.o. Gender: female  Primary Care Provider: Maryjean Ka, MD Consultants: Neurology Code Status: Full   Pt Overview and Major Events to Date:  8/15 - admitted, concern for acute stroke, +/- infection with delirium overlaping known dementia  8/16 - right pons acute stroke on MRI, left vertebral artery occlusion on MRA 8/17 - Afib with RVR, apixiban started   Assessment and Plan: Shannon Stokes is a 79 y.o. year old female presenting with left sided weakness suspicious for stroke confirmed on MRI 8/16. She was found to have left sided hemiparesis with + babinski on the left foot. She was also found to have a WBC 17. 8 with suspected UTI. She had complaints with burning on urination. Received Rocephin in the ED. PMH is significant for falls and questionable prior CVA per imaging from 3 years ago.   # Acute stroke in pons: found down at home uncertain length of time, so TPA was not initiated  - CT showed left cerebellar hemisphere low attenuation, ?age-indeterminate ischemia  - Neurology consulted, appreciate recommendations, proceeding with stroke work up.   - 20.1% ASCVD risk, not in statin benefit group due to age   - echo with normal EF, grade 1 diastolic dysfunction, mod MR, mild AR   - carotid dopplers suggest 1-39% bilateral stenosis, ant flow of vertebrals   - A1c 5.8, TSH wnl, lipid panel unremarkable   - MRI shows 12 mm acute infarct in right pons with extensive global chronic  ischemic changes   - MRA neck shows left vertebral occlusion, retrograde PICA flow, intracranial  carotid siphon stenoses  - continue neuro checks regularly  - Apixaban, metolprolol started new onset Afib, discontinue ASA, plavix    - permissive HTN, up to 220/120 for 72 hours post stroke  - CK 405; consider recheck, though doubt  frank rhabdo  - Neurology signing off, F/U with Dr. Pearlean Brownie, Stroke clinic in 2 weeks.  [ ]  F/U PT, OT, SLP rec's   #Afib: patient was seen to have sustained Afib with RVR with the highest HR in 160's on 8/18 - started on Apixaban, metolprolol - has been in Sinus rhythm since starting medication  - HR is tolerating new medication  # Delirium - waxing/waning, possible infectious component as below  - somnolence 8/17 possibly exacerbated by Versed for MRI on 8/16  - Seroquel BID PRN, avoid sedating meds  - soft wrist restraints to be ordered daily, though will avoid as much as possible  - continue sitter  - see also other issues, below (esp dementia)   # Possible CAP - CXR 8/15 on review shows patchy opacities possibly slightly worse than prior film, inc WOB 8/16  - concern for delirium on top of dementia/acute stroke, and with WBC possibility of PNA exists  - no frank fever, hypotension, or tachycardia, but mild tachypnea  - repeat CXR 8/16 suggests chronic opacity, not acute; WBC down to 10.1 8/17  - continue empiric treatment with ceftriaxone and azithromycin  [ ]  f/u WOB   # Abnormal UA - (+) ketones, leuks, Hb, hyaline casts, in setting of WBC 17.8; concern for UTI vs colonization  - complained of burning with urination though ROS suspect with dementia/delirium; afebrile on admission  - received one dose of Rocephin in the ED; WBC 14.4 on 8/15  - on abx empirically for possible CAP, as  above  - urine culture: 9,000 colonies, gram negative rods    #Malnurtrition: albumin low 3.0, prealbumin 12.6  - likely chronic/multifactorial (pt lives alone, cooks and cleans for herself, etc); see social below  - consider nutrition c/s   #Dementia: baseline MMSE 16/30 by Dr. Casper Harrison as an outpt within the past couple of months, recently started Aricept  -continue Aricept (pt refusing, currently)  -pt also currently with component of delirium; continue bedside sitter, attempt to avoid oversedating  meds  -pt does not appear to have capacity to make her own decisions, currently (see Social, below)  -doubt need for psychiatry input at this time, as pt is very disoriented and has an actively involved HCPOA   #HTN: not treated in outpatient setting due to her age and fear of hypotension; generally 150's-170's/60's-80's  -monitor clinically; permissive in setting of acute stroke, regardless   #Chronic arthritic pain - scheduled Tylenol   #Social: HCPOA: Dossie Arbour") [place] (family friend) - home [phone], cell [phone]  -see progress notes from Dr. Casper Harrison 8/16; pt is limited code (all measures but not prolonged or heroic efforts)  -CSW involved, appreciate assistance; HCPOA willing to conduct SNF search   FEN/GI: dysphagia 1 diet,  NS 50 mL/hr  Prophylaxis: enoxaparin  Disposition:management as above; strongly doubt pt will be able to return home alone at any point  Subjective: Patient had no overnight events and resting comfortably this morning.   Objective: Temp:  [98.1 F (36.7 C)-99.2 F (37.3 C)] 99 F (37.2 C) (08/18 0400) Pulse Rate:  [62-116] 62 (08/18 0400) Resp:  [18-22] 20 (08/18 0400) BP: (129-178)/(53-102) 168/53 mmHg (08/18 0400) SpO2:  [94 %-99 %] 95 % (08/18 0400) Physical Exam: General: elderly female in NAD,   HEENT:left sided facial droop most noticeable at corner of mouth  Cardiovascular: S1S2, RRR, no murmur appreciated  Respiratory: coarse breath sounds throughout,  Abdomen: +BS, soft, non tender, non distended  Extremities: generally unchanged from 8/16, left-sided weakness remains  strength 3-4/5 on right upper and lower extremities though difficult to judge hand strength (in mitts)  4+/5 left upper extremity and lower extremity , no edema noted  Skin: no rash or obvious breakdown noted, though full skin exam not performed  Neuro: facial droop and extremity weakness as above; oriented to herself but not place, time, date.     Laboratory:  Recent Labs Lab 11/23/12 2145 11/24/12 0500 11/26/12 0755  WBC 17.8* 14.4* 10.1  HGB 14.7 14.2 13.0  HCT 43.9 41.9 37.9  PLT 166 189 172    Recent Labs Lab 11/23/12 2145 11/26/12 0755  NA 138 138  K 4.1 3.9  CL 105 108  CO2 20 22  BUN 33* 13  CREATININE 0.86 0.81  CALCIUM 9.7 9.2  PROT 6.1  --   BILITOT 0.7  --   ALKPHOS 70  --   ALT 10  --   AST 34  --   GLUCOSE 107* 141*    Imaging/Diagnostic Tests: CXR 8/15: patchy interstitial bilateral opacities, similar to previous films but possibly slightly worse   CT HEAD 8/14 @2220   IMPRESSION:  1. Ill-defined area of low attenuation in the left cerebellar  hemisphere is new compared to prior studies and may represent age  indeterminate ischemia. This could be further evaluated with MRI  of the brain if clinically indicated.  2. Encephalomalacia in the left occipital region related to an old  infarction.  3. Moderate cerebral and cerebellar atrophy with extensive chronic  microvascular ischemic  changes in the cerebral white matter, as  Above.   CT CERVICAL SPINE 8/14 @2220   IMPRESSION:  No evidence of significant acute traumatic injury to the cervical  spine.   8/15: EKG sinus rhythm with sinus arrhythmia with PVC's   2D Echo, 8/15: -Normal LV cavity size, wall thickness, systolic function; EF 55-60%; grade 1 diastolic dysfunction  -mild AV regurg  -mild MV prolapse of post leaflet; overall mod mitral regurg  -LA mod dilated  -peak PA pressure 37 mm Hg   Clare Gandy, MD 11/27/2012, 6:51 AM PGY-1, [facility] FPTS Intern pager: [phone], text pages welcome

## 2012-11-27 NOTE — Clinical Documentation Improvement (Signed)
THIS DOCUMENT IS NOT A PERMANENT PART OF THE MEDICAL RECORD  Please update your documentation with the medical record to reflect your response to this query. If you need help knowing how to do this please call [phone].  11/27/12   Dear Dr. Casper Harrison,  Per chart patient has dementia now with acute delirium with confusion and agitation requiring soft restraints and prn Seroquel. Please clarify patient's current condition.  Thank you.  Possible Clinical Conditions? - dementia with behavioral disturbances - dementia baseline - other condition (please specify)  You may use possible, probable, or suspect with inpatient documentation. possible, probable, suspected diagnoses MUST be documented at the time of discharge  Reviewed: additional documentation in the medical record -please see brief progress note 8/18  Bobbye Morton, MD PGY-2, [facility]

## 2012-11-27 NOTE — Progress Notes (Signed)
CSW spoke with pt friend, Chanetta Marshall, and informed of bed offers. Pt friend to review offers and inform CSW of choice.  Shiron Whetsel, LCSWA [phone]

## 2012-11-27 NOTE — Progress Notes (Signed)
Pts foley discontinued per protocol. 475cc urine emptied from the bag. Will monitor patient for first void.

## 2012-11-27 NOTE — Progress Notes (Signed)
Physical Therapy Treatment Patient Details Name: Shannon Stokes MRN: [id number] DOB: 17-Jan-1917 Today's Date: 11/27/2012 Time: 4098-1191 PT Time Calculation (min): 35 min  PT Assessment / Plan / Recommendation  History of Present Illness 79 yo F with baseline dementia found down at home by [facility] with new evidence of L cerebellar stroke on CT and L sided facial droop and weakness.   PT Comments   Pt is progressing very well today compared to last session.  She is much more alert (although not oriented) and participatory with PT/OT today.  She has significant sitting and standing balance deficits, but bil LEs seem to be still very strong (no significant differences noted with MMT today EOB).  I continue to think that SNF is her best therapy destination as she would need 24 hour assistance and supervision at discharge with her physical and cognitive deficits and I did not see anywhere in the chart that there is family willing/able to provide this assistance.    Follow Up Recommendations  SNF     Does the patient have the potential to tolerate intense rehabilitation    Possibly  Barriers to Discharge  Questionable caregiver support at discharge (pt was independent living alone PTA)/        Equipment Recommendations  None recommended by PT    Recommendations for Other Services   None  Frequency Min 4X/week   Progress towards PT Goals Progress towards PT goals: Progressing toward goals  Plan Current plan remains appropriate    Precautions / Restrictions Precautions Precautions: Fall Precaution Comments: left sided inattention, Left and posterior lean   Pertinent Vitals/Pain See vitals flow sheet.     Mobility  Bed Mobility Bed Mobility: Supine to Sit;Sitting - Scoot to Edge of Bed Supine to Sit: 1: +2 Total assist;With rails;HOB elevated Supine to Sit: Patient Percentage: 50% Sitting - Scoot to Edge of Bed: 1: +2 Total assist;With rail Sitting - Scoot to [place] of Bed: Patient  Percentage: 10% Details for Bed Mobility Assistance: Pt was able to initiate movement of bil legs to EOB, but was unable to fully complete without assist.  Bil hand held assist and support at trunk to pull to sitting.  Assist to scoot at trunk and weight shifting hips due to decreased sitting balance.   Transfers Transfers: Sit to Stand;Stand to Sit;Stand Pivot Transfers Sit to Stand: 1: +2 Total assist;From elevated surface;With upper extremity assist;From bed Sit to Stand: Patient Percentage: 50% Stand to Sit: 1: +2 Total assist;With upper extremity assist;To chair/3-in-1;With armrests Stand to Sit: Patient Percentage: 50% Stand Pivot Transfers: 1: +2 Total assist;From elevated surface;With armrests Stand Pivot Transfers: Patient Percentage: 50% Details for Transfer Assistance: pt leaning to the left during standing and transfers.  She was able to take good weight on bil feet, but was unable to stand fully upright.  Pt reaching for bed rail and then arm rest in chair for support during transfer.  Support given at trunk for balance and at hips for upright posture.   Ambulation/Gait Ambulation/Gait Assistance: Not tested (comment) (pt is not ready for gait yet) Modified Rankin (Stroke Patients Only) Pre-Morbid Rankin Score: No symptoms Modified Rankin: Severe disability      PT Goals (current goals can now be found in the care plan section) Acute Rehab PT Goals Patient Stated Goal: Unable to state due to cognition  Visit Information  Last PT Received On: 11/27/12 Assistance Needed: +2 PT/OT Co-Evaluation/Treatment: Yes History of Present Illness: 79 yo F with baseline dementia found  down at home by [facility] with new evidence of L cerebellar stroke on CT and L sided facial droop and weakness.    Subjective Data  Subjective: Pt awake and alert, although not oriented at all.   Patient Stated Goal: Unable to state due to cognition   Cognition  Cognition Arousal/Alertness:  Awake/alert Behavior During Therapy: WFL for tasks assessed/performed Overall Cognitive Status: Impaired/Different from baseline Area of Impairment: Orientation;Attention;Memory;Safety/judgement;Awareness;Problem solving Orientation Level: Disoriented to;Person;Place;Time;Situation Current Attention Level: Focused Memory: Decreased short-term memory (decreased memory in general, unable to state year of birth) Following Commands: Follows one step commands consistently Safety/Judgement: Decreased awareness of safety;Decreased awareness of deficits Awareness: Intellectual Problem Solving: Slow processing;Difficulty sequencing;Requires verbal cues;Requires tactile cues General Comments: Pt was better at initiating tasks today, but needed cue to maintain focused attention and to attend to left side.  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  Pt with left and posterior lean in sitting.  Pt is unaware of balance deficits and does not self correct without max verbal and manual assist Static Standing Balance Static Standing - Balance Support: Bilateral upper extremity supported Static Standing - Level of Assistance: 1: +2 Total assist;Patient percentage (comment) Static Standing - Comment/# of Minutes: pt 50%.  Stood with two person assist while RN preformed peri care dut to incontinent bowel episode.  Pt was able to tolerate standing ~2 mins with flexed posture.    End of Session PT - End of Session Equipment Utilized During Treatment: Gait belt Activity Tolerance: Patient limited by fatigue Patient left: in chair;with call  bell/phone within reach;with chair alarm set;with restraints reapplied (mitten restraints)     Lurena Joiner B. Jacaria Colburn, PT, DPT [phone]   11/27/2012, 12:01 PM

## 2012-11-27 NOTE — Progress Notes (Signed)
Clarification for clinical documentation. Pt has baseline dementia and currently has some behavioral disturbances requiring intermittent soft wrist restraints and/or soft mitt gloves; likely mixed clinical picture of baseline dementia with delirium. Generally mental status improving toward baseline, but there remains significant cognitive deficit.  Bobbye Morton, MD PGY-2, [facility] 11/27/2012, 2:01 PM FPTS Service pager: [phone] (text pages welcome through [facility])

## 2012-11-27 NOTE — Progress Notes (Signed)
Stroke Team Progress Note  HISTORY Shannon Stokes is an 79 y.o. female who is unable to give any history. History obtained from the chart. It seems that the patient was found on the floor by family 11/23/2012 in the evening. At that time left sided weakness was noted. EMS was called and was brought to the hospital 11/23/2012 at 2132. Patient was last seen by family 2 days prior. Patient gives unreliable history but reports that she was on the floor overnight. Patient was not a TPA candidate secondary to unknown time last known well. She was admitted for further evaluation and treatment.  SUBJECTIVE No family at bedside. Pt sitting up in bed. "I'm at [employer], working".  OBJECTIVE Most recent Vital Signs: Filed Vitals:   11/26/12 1720 11/26/12 2000 11/27/12 0000 11/27/12 0400  BP: 142/86 154/66 165/62 168/53  Pulse: 107 71 69 62  Temp:  98.3 F (36.8 C) 98.6 F (37 C) 99 F (37.2 C)  TempSrc:  Axillary Axillary Axillary  Resp:  20 20 20   Height:      Weight:      SpO2: 96% 96% 96% 95%   CBG (last 3)   Recent Labs  11/25/12 1640  GLUCAP 98    IV Fluid Intake:   . dextrose 5 % and 0.45 % NaCl with KCl 20 mEq/L 100 mL/hr at 11/26/12 2338    MEDICATIONS  . acetaminophen  650 mg Oral Q6H  . apixaban  5 mg Oral BID  . azithromycin  250 mg Oral Daily  . cefTRIAXone (ROCEPHIN)  IV  1 g Intravenous Q24H  . donepezil  5 mg Oral QHS  . metoprolol  5 mg Intravenous Q6H   PRN:  QUEtiapine, RESOURCE THICKENUP CLEAR, senna-docusate  Diet:  Dysphagia 1 Puree, nectar thick liquids Activity:  OOB Up with assistance DVT Prophylaxis:  Apixaban  CLINICALLY SIGNIFICANT STUDIES Basic Metabolic Panel:   Recent Labs Lab 11/23/12 2145 11/26/12 0755  NA 138 138  K 4.1 3.9  CL 105 108  CO2 20 22  GLUCOSE 107* 141*  BUN 33* 13  CREATININE 0.86 0.81  CALCIUM 9.7 9.2   Liver Function Tests:   Recent Labs Lab 11/23/12 2145  AST 34  ALT 10  ALKPHOS 70  BILITOT 0.7   PROT 6.1  ALBUMIN 3.0*   CBC:   Recent Labs Lab 11/23/12 2145 11/24/12 0500 11/26/12 0755  WBC 17.8* 14.4* 10.1  NEUTROABS 15.4*  --   --   HGB 14.7 14.2 13.0  HCT 43.9 41.9 37.9  MCV 86.1 86.6 86.3  PLT 166 189 172   Coagulation:   Recent Labs Lab 11/23/12 2145  LABPROT 15.3*  INR 1.24   Cardiac Enzymes:   Recent Labs Lab 11/23/12 2145 11/24/12 0500 11/25/12 1307 11/26/12 1209  CKTOTAL  --  405*  --   --   TROPONINI <0.30  --  <0.30 <0.30   Urinalysis:   Recent Labs Lab 11/24/12 0129 11/25/12 1521  COLORURINE YELLOW YELLOW  LABSPEC 1.025 1.023  PHURINE 5.5 5.5  GLUCOSEU NEGATIVE NEGATIVE  HGBUR MODERATE* SMALL*  BILIRUBINUR NEGATIVE NEGATIVE  KETONESUR 15* 40*  PROTEINUR 100* 30*  UROBILINOGEN 0.2 1.0  NITRITE NEGATIVE POSITIVE*  LEUKOCYTESUR SMALL* NEGATIVE   Lipid Panel    Component Value Date/Time   CHOL 151 11/24/2012 0500   TRIG 70 11/24/2012 0500   HDL 67 11/24/2012 0500   CHOLHDL 2.3 11/24/2012 0500   VLDL 14 11/24/2012 0500   LDLCALC 70 11/24/2012 0500  HgbA1C  Lab Results  Component Value Date   HGBA1C 5.8* 11/24/2012    Urine Drug Screen:     Component Value Date/Time   LABOPIA NONE DETECTED 02/27/2010 1607   COCAINSCRNUR NONE DETECTED 02/27/2010 1607   LABBENZ NONE DETECTED 02/27/2010 1607   AMPHETMU NONE DETECTED 02/27/2010 1607   THCU NONE DETECTED 02/27/2010 1607   LABBARB  Value: NONE DETECTED        DRUG SCREEN FOR MEDICAL PURPOSES ONLY.  IF CONFIRMATION IS NEEDED FOR ANY PURPOSE, NOTIFY LAB WITHIN 5 DAYS.        LOWEST DETECTABLE LIMITS FOR URINE DRUG SCREEN Drug Class       Cutoff (ng/mL) Amphetamine      1000 Barbiturate      200 Benzodiazepine   200 Tricyclics       300 Opiates          300 Cocaine          300 THC              50 02/27/2010 1607    Alcohol Level: No results found for this basename: ETH,  in the last 168 hours  CT Cervical Spine 11/23/2012    No evidence of significant acute traumatic injury to the  cervical spine.    CT of the brain  11/23/2012   1.  Ill-defined area of low attenuation in the left cerebellar hemisphere is new compared to prior studies and may represent age indeterminate ischemia.  This could be further evaluated with MRI of the brain if clinically indicated. 2.  Encephalomalacia in the left occipital region related to an old infarction. 3.  Moderate cerebral and cerebellar atrophy with extensive chronic microvascular ischemic changes in the cerebral white matter  MRI of the brain  12 mm acute infarction within the right pons. No evidence of acute cerebellar infarct.   MRA of the brain  The left vertebral artery is occluded. There is retrograde flow to pica. This finding is new since the study of 2011  2D Echocardiogram  55% to 60%. Doppler parameters are consistent with abnormal left ventricular relaxation (grade 1 diastolic dysfunction).  Carotid Doppler  No evidence of hemodynamically significant internal carotid artery stenosis. Vertebral artery flow is antegrade.   CXR    EKG  Sinus rhythm with sinus arrhythmia with occasional Premature ventricular complexes  Therapy Recommendations SNF  Physical Exam   Elderly Caucasian lady restless in bed.restrained.  Afebrile. Head is nontraumatic. Neck is supple without bruit.  . Cardiac exam no murmur or gallop. Lungs are clear to auscultation. Distal pulses are well felt Neurological exam ; limited due to patient's significant baseline dementia and lack of cooperation. Awake and alert follows simple midline commands only. Oriented only to self. Diminished attention, registration and recall. Speaks short sentences. Extraocular movements full range. No facial weakness. Tongue midline. Moves all 4 extremities but left-sided less than right. Mild weakness of left grip and hip flexors. Left plantar equivocal right downgoing.  .ASSESSMENT Shannon Stokes is a 79 y.o. female found down with confusion and left sided weakness. Initial  imaging confirms a left cerebellar lesion of unknown age, not associated with current symptoms. MRI brain confirms a R pontine infarct with L vertebral occlusion not contributing to current stroke. Infarct etiology embolic secondary to new onset atrial fibrillation. On aspirin 81 mg orally every day prior to admission. Now on apixaban for secondary stroke prevention. Patient with resultant left hemiparesis.   atrial fibrillation with RVR,  started on apixaban Hypertension Hx stroke Baseline dementia Leukocytosis, improving, likely CAP on ceftriaxone and azithromycin  Hospital day # 4  TREATMENT/PLAN  Agree with apixaban for secondary stroke prevention   Change D5.45 to normal saline. Glucose is contraindicated in acute stroke given its role in cerebral edema, isotonic solutions preferred.  SNF at discharge. SW following No further stroke workup indicated. Patient has a 10-15% risk of having another stroke over the next year, the highest risk is within 2 weeks of the most recent stroke/TIA (risk of having a stroke following a stroke or TIA is the same). Ongoing risk factor control by Primary Care Physician Stroke Service will sign off. Please call should any needs arise. Follow up with Dr. Pearlean Brownie, Stroke Clinic, in 2 months.  Annie Main, MSN, RN, ANVP-BC, ANP-BC, [facility] Pager: [phone] 11/27/2012 9:18 AM  I have personally obtained a history, examined the patient, evaluated imaging results, and formulated the assessment and plan of care. I agree with the above. Delia Heady, MD

## 2012-11-27 NOTE — Progress Notes (Signed)
FMTS Attending Admission Note: Lujain Kraszewski,MD I  have seen and examined this patient, reviewed their chart. I have discussed this patient with the resident. I agree with the resident's findings, assessment and care plan.  Patient doing well today,denies any concern,she is asking when she would be going home. She is awake alert and oriented,still having extremity weakness. I agree with current CVA management. To transition from IV metoprolol to oral for her Afib rate control.She will benefit from SNF with close neuro follow up.

## 2012-11-28 ENCOUNTER — Non-Acute Institutional Stay: Payer: Medicare Other | Admitting: Family Medicine

## 2012-11-28 ENCOUNTER — Telehealth: Payer: Self-pay | Admitting: Family Medicine

## 2012-11-28 DIAGNOSIS — I639 Cerebral infarction, unspecified: Secondary | ICD-10-CM

## 2012-11-28 DIAGNOSIS — I4891 Unspecified atrial fibrillation: Secondary | ICD-10-CM

## 2012-11-28 DIAGNOSIS — I6350 Cerebral infarction due to unspecified occlusion or stenosis of unspecified cerebral artery: Secondary | ICD-10-CM

## 2012-11-28 DIAGNOSIS — R410 Disorientation, unspecified: Secondary | ICD-10-CM

## 2012-11-28 DIAGNOSIS — I10 Essential (primary) hypertension: Secondary | ICD-10-CM

## 2012-11-28 DIAGNOSIS — R404 Transient alteration of awareness: Secondary | ICD-10-CM

## 2012-11-28 DIAGNOSIS — E43 Unspecified severe protein-calorie malnutrition: Secondary | ICD-10-CM

## 2012-11-28 DIAGNOSIS — F068 Other specified mental disorders due to known physiological condition: Secondary | ICD-10-CM

## 2012-11-28 LAB — URINE CULTURE

## 2012-11-28 MED ORDER — APIXABAN 5 MG PO TABS: 5.0000 mg | ORAL_TABLET | Freq: Two times a day (BID) | ORAL | Status: DC

## 2012-11-28 MED ORDER — METOPROLOL TARTRATE 12.5 MG HALF TABLET: 12.5000 mg | ORAL_TABLET | Freq: Two times a day (BID) | ORAL | Status: DC

## 2012-11-28 MED ORDER — QUETIAPINE FUMARATE 25 MG PO TABS: 25.0000 mg | ORAL_TABLET | Freq: Two times a day (BID) | ORAL | Status: DC | PRN

## 2012-11-28 NOTE — Telephone Encounter (Signed)
Mr. Shannon Stokes is wanting to talk to you concerning moving [name] to a rehab facility.  Please call him asap because she is supposed to be discharged from the hospital soon.

## 2012-11-28 NOTE — Care Management Note (Signed)
    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     Action/Plan:   Plan for SNF at d/c. CSW assisting family with disposition needs. Hopefully plan will be for d/c today. CM did speak to Resident at 1023 in reference to d/c for today. No needs from CM.   Anticipated DC Date:  11/28/2012   Anticipated DC Plan:  SKILLED NURSING FACILITY  In-house referral  Clinical Social Worker      DC Planning Services  CM consult      Choice offered to / List presented to:             Status of service:  Completed, signed off Medicare Important Message given?   (If response is "NO", the following Medicare IM given date fields will be blank) Date Medicare IM given:   Date Additional Medicare IM given:    Discharge Disposition:  SKILLED NURSING FACILITY  Per UR Regulation:  Reviewed for med. necessity/level of care/duration of stay  If discussed at Long Length of Stay Meetings, dates discussed:    Comments:

## 2012-11-28 NOTE — Discharge Summary (Signed)
Family Medicine Teaching [facility] Discharge Summary  Patient name: Shannon Stokes Medical record number: [id number] Date of birth: Aug 15, 1916 Age: 79 y.o. Gender: female Date of Admission: 11/23/2012  Date of Discharge: 11/23/12 Admitting Physician: Lora Paula, MD  Primary Care Provider: Maryjean Ka, MD Consultants: Neurology   Indication for Hospitalization: left hemiparesis   Discharge Diagnoses/Problem List:  Acute stroke  Afib  Malnutrition HTN  Disposition: SNF  Discharge Condition: stable  Brief Hospital Course:  Shannon Stokes is a 79 y.o. year old female presenting with left sided hemiparesis suspicious for stroke confirmed on MRI 8/16. She was found to have left sided hemiparesis with + babinski on the left foot. She was also found to have a WBC 17. 8 with suspected UTI. She had complaints with burning on urination. Received Rocephin in the ED. PMH is significant for falls and questionable prior CVA per imaging from 3 years ago.   # Acute stroke in pons: found down at home uncertain length of time, so TPA was not initiated  - CT showed left cerebellar hemisphere low attenuation, question age-indeterminate ischemia   - 20.1% ASCVD risk, not in statin benefit group due to age   - echo with normal EF, grade 1 diastolic dysfunction, mod MR, mild AR   - carotid dopplers suggest 1-39% bilateral stenosis, ant flow of vertebrals   - A1c 5.8, TSH wnl, lipid panel unremarkable    - MRI shows 12 mm acute infarct in right pons with extensive global chronic ischemic changes    - MRA neck shows left vertebral occlusion, retrograde PICA flow, intracranial carotid siphon stenoses  - Apixaban, metolprolol started new onset Afib, discontinue ASA, plavix   #Afib: patient was seen to have sustained Afib with RVR with the highest HR in 160's on 8/18  - started on Apixaban, metoprolol, has been in Sinus rhythm since starting medication  - HR is tolerating new medication    # Delirium - waxing/waning, possible infectious component as below  - Seroquel BID PRN, avoid sedating meds   - see also other issues, below (esp dementia)   # Possible CAP (Resolved) - CXR 8/15 on review shows patchy opacities possibly slightly worse than prior film  - concern for delirium on top of dementia/acute stroke, and with WBC possibility of PNA exists  - repeat CXR 8/16 suggests chronic opacity  - azithromycin 8/16>8/19 -ceftriaxone 8/16>8/18   # Abnormal UA (Resolved) - (+) ketones, leuks, Hb, hyaline casts, in setting of WBC 17.8; concern for UTI vs colonization  - complained of burning with urination though ROS suspect with dementia/delirium; afebrile on admission  - received one dose of Rocephin in the ED; WBC 14.4 on 8/15  - on abx empirically for possible CAP, as above  - urine culture: 9,000 colonies, gram negative rods  - Foley removed on 8/18, she has urine and stool incontinence.   #Malnurtrition: albumin low 3.0, prealbumin 12.6  - Dysphagia 1 diet while inpatient  -Rec continue diet with strict precautions and recommend dietician consult to help maximize pt's intake on her restricted diet. - likely chronic/multifactorial (pt lived alone, cooks and cleans for herself, etc)   #Dementia: baseline MMSE 16/30 by Dr. Casper Harrison as an outpt within the past couple of months, recently started Aricept  -continue Aricept (pt refusing, currently)   -pt does not appear to have capacity to make her own decisions, currently (see Social, below)   #HTN: not treated in outpatient setting due to her age and fear of  hypotension; generally 150's-170's/60's-80's  -monitor clinically  #Chronic arthritic pain - scheduled Tylenol   #Social: HCPOA: Dossie Arbour") Burk[place] (family friend) - home [phone], cell [phone]  -Clarified pt's code status - pt and [name] have in the past discussed some end of life issues, and pt has expressed in the past that she 1) would NOT want  to live in a persistent vegetative state and 2) would NOT want heroic efforts at resuscitation if such efforts were difficult or prolonged and would result in significant discomfort or injury (i.e., pt is a "full code" in that she would desire all appropriate measures such as CPR, intubation, medications, etc, but would not want, in Mr. Archie Patten words, "being worked on hard for an hour if she's not coming back").   - Ms Millis' current status is such that she clearly cannot make her own decisions or care for herself, it is not certain that she will not improve; it is possible that she may return to her previous mental baseline with some residual physical weakness, and/or that her physical weakness may improve. However, it is also possible that she may continue to decline and/or require more aggressive measures for treatment and/or diagnosis. This was discussed with [name] as well, and I stated to him that while we are not definitely in the situation of needing to address full palliative/comfort care measures, that if she continues to decline, that such measures are an alternative to aggressive care. [name] stated that he would be open to discussions about comfort/Hospice-style care if necessary, and would potentially be open to discussions with the FPTS providers and/or the palliative care consult team. He expressed his gratitude for the care and consideration Ms Milburn is being given and had no further questions for me at this time.   Issues for Follow Up:  1. Stroke: continued on apixaban for anticoagulation for new onset Afib. Follow up with Dr. Pearlean Brownie on 8/25 at 2:15pm  2. Afib: continued Apixaban and metoprolol. Tolerating well.  3. Malnutrition: continue diet with strict precautions and recommend dietician consult to help maximize pt's intake on her restricted diet. 4. Dementia/Delierium: was agitated (non-violent) Seroquel BID PRN and wrist restraints were instituted. Continued on  Aricept. 5. Urinary incontenence - Foley removed 8/18, she has urine and stool incontinence. 5. HTN: monitor, previously discussed about starting medication but avoided due to age and risk of hypotension   Significant Procedures: none   Significant Labs and Imaging:   Recent Labs Lab 11/24/12 0500 11/26/12 0755 11/27/12 1152  WBC 14.4* 10.1 10.2  HGB 14.2 13.0 13.1  HCT 41.9 37.9 38.6  PLT 189 172 183    Recent Labs Lab 11/23/12 2145 11/26/12 0755  NA 138 138  K 4.1 3.9  CL 105 108  CO2 20 22  GLUCOSE 107* 141*  BUN 33* 13  CREATININE 0.86 0.81  CALCIUM 9.7 9.2  ALKPHOS 70  --   AST 34  --   ALT 10  --   ALBUMIN 3.0*  --    CT Cervical Spine 11/23/2012 No evidence of significant acute traumatic injury to the cervical spine.   CT of the brain 11/23/2012 1. Ill-defined area of low attenuation in the left cerebellar hemisphere is new compared to prior studies and may represent age indeterminate ischemia. This could be further evaluated with MRI of the brain if clinically indicated. 2. Encephalomalacia in the left occipital region related to an old infarction. 3. Moderate cerebral and cerebellar atrophy with extensive chronic  microvascular ischemic changes in the cerebral white matter   MRI of the brain 12 mm acute infarction within the right pons. No evidence of acute cerebellar infarct.   MRA of the brain The left vertebral artery is occluded. There is retrograde flow to pica. This finding is new since the study of 2011   2D Echocardiogram 55% to 60%. Doppler parameters are consistent with abnormal left ventricular relaxation (grade 1 diastolic dysfunction).  Carotid Doppler No evidence of hemodynamically significant internal carotid artery stenosis. Vertebral artery flow is antegrade.   CXR 8/15: patchy interstitial bilateral opacities, similar to previous films but possibly slightly worse   CXR 8/16: Chronic mild interstitial prominence without focal consolidation. No  convincing pulmonary edema.   EKG Sinus rhythm with sinus arrhythmia with occasional Premature ventricular complexes  Outstanding Results: none  Discharge Medications:    Medication List    STOP taking these medications       ASPIR-LOW 81 MG EC tablet  Generic drug:  aspirin      TAKE these medications       apixaban 5 MG Tabs tablet  Commonly known as:  ELIQUIS  Take 1 tablet (5 mg total) by mouth 2 (two) times daily.     donepezil 5 MG tablet  Commonly known as:  ARICEPT  Take 1 tablet (5 mg total) by mouth at bedtime as needed.     DRISDOL 62952 UNITS capsule  Generic drug:  ergocalciferol  Take 50,000 Units by mouth once a week.     metoprolol tartrate 12.5 mg Tabs tablet  Commonly known as:  LOPRESSOR  Take 0.5 tablets (12.5 mg total) by mouth 2 (two) times daily.     MULTIVITAMINS PO  Take 1 tablet by mouth daily.     QUEtiapine 25 MG tablet  Commonly known as:  SEROQUEL  Take 1 tablet (25 mg total) by mouth 2 (two) times daily as needed (agitation).        Discharge Instructions: Please refer to Patient Instructions section of EMR for full details.  Patient was counseled important signs and symptoms that should prompt return to medical care, changes in medications, dietary instructions, activity restrictions, and follow up appointments.   Follow-Up Appointments: Follow-up Information   Follow up with Gates Rigg, MD On 12/04/2012. ( 2:15 pm, arrive at 2:00)    Specialties:  Neurology, Radiology   Contact information:   [address] [place] [address] [phone]       Clare Gandy, MD 11/28/2012, 2:17 PM PGY-1, [facility]

## 2012-11-28 NOTE — Progress Notes (Signed)
Family Medicine Teaching Service Daily Progress Note Intern Pager: [phone]  Patient name: Shannon Stokes Medical record number: [id number] Date of birth: 26-Feb-1917 Age: 79 y.o. Gender: female  Primary Care Provider: Maryjean Ka, MD Consultants: Neurology  Code Status: partial   Pt Overview and Major Events to Date:  8/15 - admitted, concern for acute stroke, +/- infection with delirium overlaping known dementia  8/16 - right pons acute stroke on MRI, left vertebral artery occlusion on MRA  8/17 - Afib with RVR, apixiban started 8/18 - HCPOA informed of bed offers for placement  8/19 - Foley removed 8/18; urinary and stool incontinent   Assessment and Plan: Shannon Stokes is a 79 y.o. year old female presenting with left sided hemiparesis suspicious for stroke confirmed on MRI 8/16. She was found to have left sided hemiparesis with + babinski on the left foot. She was also found to have a WBC 17. 8 with suspected UTI. She had complaints with burning on urination. Received Rocephin in the ED. PMH is significant for falls and questionable prior CVA per imaging from 3 years ago.   # Acute stroke in pons: found down at home uncertain length of time, so TPA was not initiated  - CT showed left cerebellar hemisphere low attenuation, ?age-indeterminate ischemia    - 20.1% ASCVD risk, not in statin benefit group due to age   - echo with normal EF, grade 1 diastolic dysfunction, mod MR, mild AR   - carotid dopplers suggest 1-39% bilateral stenosis, ant flow of vertebrals   - A1c 5.8, TSH wnl, lipid panel unremarkable   - MRI shows 12 mm acute infarct in right pons with extensive global chronic  ischemic changes   - MRA neck shows left vertebral occlusion, retrograde PICA flow, intracranial  carotid siphon stenoses  - continue neuro checks regularly  - Apixaban, metolprolol started new onset Afib, discontinue ASA, plavix  - CK 405; consider recheck, though doubt frank rhabdo  - Neurology  signing off, F/U with Dr. Pearlean Brownie, Stroke clinic in 2 weeks.  - PT/OT: SNF placement   #Afib: patient was seen to have sustained Afib with RVR with the highest HR in 160's on 8/18  - started on Apixaban, metolprolol  - has been in Sinus rhythm since starting medication  - HR is tolerating new medication   # Delirium - waxing/waning, possible infectious component as below  - somnolence 8/17 possibly exacerbated by Versed for MRI on 8/16  - Seroquel BID PRN, avoid sedating meds  - soft wrist restraints to be ordered daily, though will avoid as much as possible  - see also other issues, below (esp dementia)   # Possible CAP - CXR 8/15 on review shows patchy opacities possibly slightly worse than prior film, inc WOB 8/16  - concern for delirium on top of dementia/acute stroke, and with WBC possibility of PNA exists  - no frank fever, hypotension, or tachycardia, but mild tachypnea  - repeat CXR 8/16 suggests chronic opacity, not acute; WBC down to 10.2 8/18 - continue empiric treatment with azithromycin  -ceftriaxone 8/16>8/18  # Abnormal UA - (+) ketones, leuks, Hb, hyaline casts, in setting of WBC 17.8; concern for UTI vs colonization  - complained of burning with urination though ROS suspect with dementia/delirium; afebrile on admission  - received one dose of Rocephin in the ED; WBC 14.4 on 8/15  - on abx empirically for possible CAP, as above  - urine culture: 9,000 colonies, gram negative rods   #Malnurtrition:  albumin low 3.0, prealbumin 12.6  - likely chronic/multifactorial (pt lives alone, cooks and cleans for herself, etc); see social below  - consider nutrition c/s   #Dementia: baseline MMSE 16/30 by Dr. Casper Harrison as an outpt within the past couple of months, recently started Aricept  -continue Aricept (pt refusing, currently)  -pt also currently with component of delirium; continue bedside sitter, attempt to avoid oversedating meds  -pt does not appear to have capacity to make  her own decisions, currently (see Social, below)  -doubt need for psychiatry input at this time, as pt is very disoriented and has an actively involved HCPOA   #HTN: not treated in outpatient setting due to her age and fear of hypotension; generally 150's-170's/60's-80's  -monitor clinically; permissive in setting of acute stroke, regardless   #Chronic arthritic pain - scheduled Tylenol   #Social: HCPOA: Dossie Arbour") [place] (family friend) - home [phone], cell [phone]  -see progress notes from Dr. Casper Harrison 8/16; pt is limited code (all measures but not prolonged or heroic efforts)  -CSW involved, appreciate assistance; HCPOA willing to conduct SNF search   FEN/GI: dysphagia 1 diet, NS 50 mL/hr  Prophylaxis: enoxaparin  Disposition: placement pending improvement   Subjective: Patient had no overnight events. She did not recognize me when I went into the room. Her   Objective: Temp:  [97.8 F (36.6 C)-99.2 F (37.3 C)] 98.5 F (36.9 C) (08/19 0758) Pulse Rate:  [64-77] 74 (08/19 0758) Resp:  [16-18] 18 (08/19 0758) BP: (154-182)/(62-82) 171/62 mmHg (08/19 0758) SpO2:  [94 %-98 %] 96 % (08/19 0758)  Intake/Output Summary (Last 24 hours) at 11/28/12 0957 Last data filed at 11/28/12 0600  Gross per 24 hour  Intake   1150 ml  Output   1225 ml  Net    -75 ml    Physical Exam: General: elderly female in NAD,  HEENT:left sided facial droop most noticeable at corner of mouth  Cardiovascular: S1S2, RRR, no murmur appreciated  Respiratory: CTAB, no wheezing/crackles  Abdomen: +BS, soft, non tender, non distended  Extremities: generally unchanged from 8/16, left-sided hemiparesis remains  strength 3-4/5 on right upper and lower extremities though difficult to judge hand strength (in mitts)  4+/5 left upper extremity and lower extremity , no edema noted  Skin: no rash or obvious breakdown noted, though full skin exam not performed  Neuro: facial droop and extremity  weakness as above;Marland Kitchen  Laboratory:  Recent Labs Lab 11/24/12 0500 11/26/12 0755 11/27/12 1152  WBC 14.4* 10.1 10.2  HGB 14.2 13.0 13.1  HCT 41.9 37.9 38.6  PLT 189 172 183    Recent Labs Lab 11/23/12 2145 11/26/12 0755  NA 138 138  K 4.1 3.9  CL 105 108  CO2 20 22  BUN 33* 13  CREATININE 0.86 0.81  CALCIUM 9.7 9.2  PROT 6.1  --   BILITOT 0.7  --   ALKPHOS 70  --   ALT 10  --   AST 34  --   GLUCOSE 107* 141*    Imaging/Diagnostic Tests: CT Cervical Spine 11/23/2012 No evidence of significant acute traumatic injury to the cervical spine.   CT of the brain 11/23/2012 1. Ill-defined area of low attenuation in the left cerebellar hemisphere is new compared to prior studies and may represent age indeterminate ischemia. This could be further evaluated with MRI of the brain if clinically indicated. 2. Encephalomalacia in the left occipital region related to an old infarction. 3. Moderate cerebral and cerebellar atrophy with extensive  chronic microvascular ischemic changes in the cerebral white matter   MRI of the brain 12 mm acute infarction within the right pons. No evidence of acute cerebellar infarct.   MRA of the brain The left vertebral artery is occluded. There is retrograde flow to pica. This finding is new since the study of 2011   2D Echocardiogram 55% to 60%. Doppler parameters are consistent with abnormal left ventricular relaxation (grade 1 diastolic dysfunction).  Carotid Doppler No evidence of hemodynamically significant internal carotid artery stenosis. Vertebral artery flow is antegrade.   CXR 8/15: patchy interstitial bilateral opacities, similar to previous films but possibly slightly worse  CXR 8/16: Chronic mild interstitial prominence without focal consolidation. No convincing pulmonary edema.  EKG Sinus rhythm with sinus arrhythmia with occasional Premature ventricular complexes   Clare Gandy, MD 11/28/2012, 9:47 AM PGY-1, [facility] Family  Medicine FPTS Intern pager: [phone], text pages welcome

## 2012-11-28 NOTE — Progress Notes (Signed)
CSW confirmed with facility that pt can dc. Prepared pt dc packet and placed with shadow chart. CSW arranged ambulance transport and notified pt nurse. CSW signing off.  Sheyla Zaffino, LCSWA [phone]

## 2012-11-28 NOTE — Progress Notes (Signed)
11/28/12 1523  PT Visit Information  Last PT Received On 11/28/12  Assistance Needed +2  History of Present Illness 79 yo F with baseline dementia found down at home by [facility] with new evidence of L cerebellar stroke on CT and L sided facial droop and weakness.  PT Time Calculation  PT Start Time 1412  PT Stop Time 1435  PT Time Calculation (min) 23 min  Subjective Data  Subjective Pt had incontinent episode in recliner chair of urine and had no idea that it happened.    Patient Stated Goal Unable to state due to cognition  Precautions  Precautions Fall  Cognition  Arousal/Alertness Awake/alert  Behavior During Therapy [facility] for tasks assessed/performed  Overall Cognitive Status Impaired/Different from baseline  Area of Impairment Orientation;Attention;Memory;Safety/judgement;Awareness;Problem solving  Orientation Level Disoriented to;Person;Place;Time;Situation  Current Attention Level Focused  Memory Decreased short-term memory  Following Commands Follows one step commands consistently  Safety/Judgement Decreased awareness of safety;Decreased awareness of deficits  Awareness Intellectual  Problem Solving Slow processing;Difficulty sequencing;Requires verbal cues;Requires tactile cues  General Comments Pt continues to have memory issued, can't recall husband's name or where she met him.  Did not know she was at [facility], could not tell me which way she was leaning in sitting.    Bed Mobility  Bed Mobility Not assessed (due to pt already OOB in recliner chair)  Transfers  Transfer via Teacher, English as a foreign language  Details for Transfer Assistance Due to pt fatigue used the [place] lift to stand the pt.  Pt tolerated this very well and was able to stand long enough to get her cleaned and trade out all the linens and pillows from under her (>5 mins).    Ambulation/Gait  Ambulation/Gait Assistance Not tested (comment) (pt unable)  Modified Rankin (Stroke Patients Only)  Pre-Morbid Rankin  Score 0  Modified Rankin 5  Static Standing Balance  Static Standing - Balance Support Bilateral upper extremity supported  Static Standing - Level of Assistance 1: +2 Total assist;Patient percentage (comment)  Static Standing - Comment/# of Minutes pt 50%  PT - End of Session  Activity Tolerance Patient limited by fatigue  Patient left in chair;with call bell/phone within reach;with restraints reapplied (mittens re applied)  PT - Assessment/Plan  PT Plan Current plan remains appropriate  PT Frequency Min 3X/week  Follow Up Recommendations SNF  PT equipment None recommended by PT  PT Goal Progression  Progress towards PT goals Progressing toward goals  PT General Charges  $$ ACUTE PT VISIT 1 Procedure  PT Treatments  $Therapeutic Activity 23-37 mins  Anselm Aumiller B. Jael Waldorf, PT, DPT [phone]

## 2012-11-28 NOTE — Progress Notes (Signed)
FMTS Attending Admission Note: Shannon Sayres,MD I  have seen and examined this patient, reviewed their chart. I have discussed this patient with the resident. I agree with the resident's findings, assessment and care plan.  

## 2012-11-28 NOTE — Progress Notes (Signed)
Family Medicine Teaching [facility] Admission History and Physical Service Pager: [phone]  Patient name: Shannon Stokes Medical record number: [id number] Date of birth: 14-Aug-1916 Age: 79 y.o. Gender: female  Primary Care Provider: Maryjean Ka, MD Consultants: Neuro Code Status: Full  Chief Complaint:  Hospital discharge s/p stroke  Assessment and Plan: Shannon Stokes is a 79 y.o. year old female w/ PMH significant for Afib, dementia and delirium, possible recent CAP, malnutrition, HTN, chronic arthitis presenting from [facility] hospital following new acute ischemic stroke of the R pons w/ L vertebral artery occlusion. Pt was previously lilving on her own.   Acute Pons Stroke: MRI showed 12mm acute infarct in R pons w/ extensive global chronic ischemic changes. Pt underwent appropriate inpt workup, initiation of various treatment modalities. PT will be vital for this pt to avoid any further loss of function and regain some ADLs - continue apixiban - f/u w/ Neuro as outpt when appt set - start atorvastatin  Afib: New dx during recent hospitalization. At times w/ RVR during admission. Sinus rhythm sustained since starting metoprolol - Continue metoprolol for rate control - continue Apixiban  Dementia/delerium: Pt w/ baseline dementia w/ episodes of delirium during hospitalization. MMSE of 16/30 during admission. Recently started on aricept but pt only takes pills intermittently - Continue Aricept - continue seroquel BID PRN. Will consider DC in near future as pt becomes familiar w/ new surroundings.  - will discuss starting Namenda w/ remainder of care team - DC seroquel - may use Haldol PRN for delerium.   Social: Pt in apparent physical and mental decline. Pt does not have capacity. HCPOA is family friend "Chanetta Marshall" [phone]).  Pt is described as a full code that would need to be stopped w/o significant, prolonged traumatic effort, per HCPOA and pt. Chanetta Marshall is also somewhat  amenable to considering hospice or palliative care in the near future.   Res: treated for questionable CAP w/ CTX for 3 days and Azitrho for 4 days. No respiratory compromise. No need to continue ABX.  - Monitor respiratory status  Malnutrition: Pt noted to have albumin of 3 and prealbumin of 12.6 during admission. In part this was thought to be due to living by herself for prolonged period of time w/ poor nutrition. Started on dysphagia 1 diet in hospital secondary to stroke.  - continue dysphagia 1 diet.  - would like continued assistance w/ nutrition - Ensure pudding TID between meals  HTN: Pt BP during recent admission was 150-170s/60-80s. Metoprolol only started for Afib. No signs of hypotension on medicaitons.  - continue to monitor for increased BP or hypotension  UTI: possible UTI w/ Ecoli noted on admission Cultures show sensitivity to Keflex - Keflex for 3 days  Disposition: pending clinical improvement and possible home care vs long-term nursing home care.   History of Present Illness: Shannon Stokes is a 79 y.o. year old female presenting for continued care at nursing home s/p hospital admission for recent ischemic stroke. No further complaints at this time  Review Of Systems: Per HPI with the following additions: Otherwise 12 point review of systems was performed and was unremarkable.  Patient Active Problem List   Diagnosis Date Noted  . Intermittent atrial fibrillation 11/26/2012  . Atrial fibrillation with rapid ventricular response 11/26/2012  . CVA (cerebral infarction) 11/24/2012  . Hemiplegia, unspecified, affecting nondominant side 11/24/2012  . Personal history of fall 08/22/2012  . Long toenail 08/21/2012  . DEMENTIA 03/11/2010  . GAIT IMBALANCE 03/11/2010  . HTN (hypertension)  03/11/2010   Past Medical History: Past Medical History  Diagnosis Date  . Hypertension   . Stroke    Past Surgical History: Past Surgical History  Procedure Laterality Date  .  Appendectomy    . Ankle surgery      "as a child"   Social History: History  Substance Use Topics  . Smoking status: Never Smoker   . Smokeless tobacco: Not on file  . Alcohol Use: No   Additional social history:  Please also refer to relevant sections of EMR.  Family History: No family history on file. Allergies and Medications: No Known Allergies Current Facility-Administered Medications on File Prior to Visit  Medication Dose Route Frequency Provider Last Rate Last Dose  . 0.9 % NaCl with KCl 20 mEq/ L  infusion   Intravenous Continuous Tawni Carnes, MD 50 mL/hr at 11/28/12 [id number]    . apixaban (ELIQUIS) tablet 5 mg  5 mg Oral BID Arman Filter, RPH   5 mg at 11/28/12 1191  . azithromycin (ZITHROMAX) tablet 250 mg  250 mg Oral Daily Stephanie Coup Street, MD   250 mg at 11/28/12 0951  . donepezil (ARICEPT) tablet 5 mg  5 mg Oral QHS Elenora Gamma, MD   5 mg at 11/27/12 2222  . metoprolol tartrate (LOPRESSOR) tablet 12.5 mg  12.5 mg Oral BID Tawni Carnes, MD   12.5 mg at 11/28/12 0951  . QUEtiapine (SEROQUEL) tablet 25 mg  25 mg Oral BID PRN Bobbye Morton, MD      . RESOURCE THICKENUP CLEAR   Oral PRN Saralyn Pilar, DO      . senna-docusate (Senokot-S) tablet 1 tablet  1 tablet Oral QHS PRN Elenora Gamma, MD       Current Outpatient Prescriptions on File Prior to Visit  Medication Sig Dispense Refill  . apixaban (ELIQUIS) 5 MG TABS tablet Take 1 tablet (5 mg total) by mouth 2 (two) times daily.  60 tablet  0  . donepezil (ARICEPT) 5 MG tablet Take 1 tablet (5 mg total) by mouth at bedtime as needed.  90 tablet  1  . ergocalciferol (DRISDOL) 50000 UNITS capsule Take 50,000 Units by mouth once a week.        . metoprolol tartrate (LOPRESSOR) 12.5 mg TABS tablet Take 0.5 tablets (12.5 mg total) by mouth 2 (two) times daily.      . Multiple Vitamin (MULTIVITAMINS PO) Take 1 tablet by mouth daily.        . QUEtiapine (SEROQUEL) 25 MG tablet Take 1 tablet (25  mg total) by mouth 2 (two) times daily as needed (agitation).        Objective: There were no vitals taken for this visit. Exam: General: NAD, WNWD elderly HEENT: MMM, mild L facial droop, EOMI Cardiovascular: RRR no m/r/g Respiratory: CTAB, nml. WOB Abdomen: soft non-tender, nondistended Extremities: warm well perfused Skin: no evidence of skin breakdown Neuro: Wheelchair bound. Mild L facial droop. L side Upper and lower extremity 3/5 strength, 4/5 strength in R Upper and lower extremities. Coordination intact. Proprioception a little delayed but globally intact.   Labs and Imaging: CBC BMET   Recent Labs Lab 11/27/12 1152  WBC 10.2  HGB 13.1  HCT 38.6  PLT 183    Recent Labs Lab 11/26/12 0755  NA 138  K 3.9  CL 108  CO2 22  BUN 13  CREATININE 0.81  GLUCOSE 141*  CALCIUM 9.2      Ozella Rocks, MD  11/28/2012, 4:15 PM PGY-3, [facility] Family Medicine

## 2012-11-28 NOTE — Telephone Encounter (Signed)
Son reports that mother has to have rehab and is concerned about moving her to nursing home - would like to know if we would still see pt at [facility] - assured that we would. Wyatt Haste, RN-BSN

## 2012-11-28 NOTE — Evaluation (Signed)
Speech Language Pathology Evaluation Patient Details Name: Shannon Stokes MRN: [id number] DOB: Feb 07, 1917 Today's Date: 11/28/2012 Time: 4098-1191 SLP Time Calculation (min): 19 min  Problem List:  Patient Active Problem List   Diagnosis Date Noted  . Intermittent atrial fibrillation 11/26/2012  . Atrial fibrillation with rapid ventricular response 11/26/2012  . CVA (cerebral infarction) 11/24/2012  . Hemiplegia, unspecified, affecting nondominant side 11/24/2012  . Personal history of fall 08/22/2012  . Long toenail 08/21/2012  . DEMENTIA 03/11/2010  . GAIT IMBALANCE 03/11/2010  . HTN (hypertension) 03/11/2010   Past Medical History:  Past Medical History  Diagnosis Date  . Hypertension   . Stroke    Past Surgical History:  Past Surgical History  Procedure Laterality Date  . Appendectomy    . Ankle surgery      "as a child"   HPI:  79 yo female found down at home.  CT head showed left encephalomalacia, left occipital, cerebellar atrophy.  Pt demonstrated weakness on the left side.  She did not pass an RN stroke swallow screen and BSE was ordered.     Assessment / Plan / Recommendation Clinical Impression  Pt presents with mild mixed dysarthria and moderately severe cognitive linguistic deficits resulting in impairments in memory, attention, problem solving, awareness, etc.  Her language is functional for basic conversations, although pt's working memory impairment impacts ability to follow complex conversations.  Pt did not recall she had a stroke within 10 seconds of being informed- even with multiple repetitions.  She was able to state her name, birthdate *not year and home address.  Perseveration also noted x3 when naming pictures, suspect new.  Pt is wearing mitts for her protection due to her cognitive deficit.    As HCPOA and family is not present, can not establish baseline cognitive function.  Regardless pt would benefit from 24/7 supervision due to cognitive deficits.      would benefit from skilled SLP to maximize functional independence re: speech, swallowing and cognitive functioning to mitigate burden of care on caregivers.         SLP Assessment  Patient needs continued Speech Lanaguage Pathology Services    Follow Up Recommendations  24 hour supervision/assistance    Frequency and Duration min 2x/week  2 weeks   Pertinent Vitals/Pain Afebrile,decreased   SLP Goals  SLP Goals Potential to Achieve Goals: Fair Potential Considerations: Ability to learn/carryover information;Previous level of function SLP Goal #1: Pt will be oriented x4 with max verbal cues to use environmental aids.  SLP Goal #2: Pt will use speech intelligibility strategies with max verbal/visual cues to increase speech intelligibiltiy to 90 percent at sentence level.  SLP Goal #3: Pt will demonstrate intellectual awareness to physical deficits and swallow changes from this stroke with max verbal, tactile, visual cues.    SLP Evaluation Prior Functioning  Cognitive/Linguistic Baseline: Information not available (unknown, HCPOA not present)  Lives With: Alone Education: worked for [employer] Level: Oriented to person;Oriented to place;Disoriented to time;Disoriented to situation Attention: Sustained;Selective Sustained Attention: Appears intact Selective Attention: Impaired Selective Attention Impairment: Functional basic;Verbal basic Memory: Impaired Memory Impairment: Storage deficit;Retrieval deficit;Decreased recall of new information;Decreased long term memory (did recall previous career, how met HCPOA,e tc) Decreased Long Term Memory: Verbal basic Awareness: Impaired Awareness Impairment: Intellectual impairment (to speech, swallow changes with cva) Problem Solving: Impaired (using mitts for protection to keep from pulling out lines) Problem Solving Impairment: Functional basic Safety/Judgment: Impaired    Comprehension  Auditory Comprehension Overall Auditory Comprehension: Appears within functional limits for tasks assessed (for basic communication, although memory deficit impairs com) Conversation: Simple Interfering Components: Working [employer] fast streamer;Attention EffectiveTechniques: Extra processing time;Slowed speech Visual Recognition/Discrimination Discrimination: Within Function Limits Common Objects: Able for objects in room Reading Comprehension Reading Status: Within funtional limits Sentence Level: Within functional limits Functional Environmental (signs, name badge): Within functional limits    Expression Expression Primary Mode of Expression: Verbal Verbal Expression Overall Verbal Expression: Appears within functional limits for tasks assessed Initiation: No impairment Level of Generative/Spontaneous Verbalization: Sentence Repetition: No impairment Naming: Impairment Responsive: Not tested Confrontation: Impaired Common Objects: Able for objects in room Convergent: 50-74% accurate (perseveration x3 of  7 noted) Verbal Errors: Perseveration Pragmatics: No impairment Effective Techniques: Phonemic cues Non-Verbal Means of Communication: Not applicable Written Expression Dominant Hand: Right Written Expression: Not tested   Oral / Motor Oral Motor/Sensory Function Overall Oral Motor/Sensory Function: Impaired (left facial and labial droop, dysarthric) Motor Speech Overall Motor Speech: Impaired Respiration: Impaired Level of Impairment: Phrase Phonation: Low vocal intensity;Breathy Resonance: Within functional limits Articulation: Impaired Level of Impairment: Phrase Intelligibility: Intelligibility reduced Word: 75-100% accurate Phrase: 75-100% accurate Sentence: 50-74% accurate Conversation: Not tested Motor Planning: Witnin functional limits Effective Techniques: Slow rate (repetition)   GO     Donavan Burnet, MS [facility] SLP [phone]

## 2012-11-28 NOTE — Progress Notes (Signed)
Speech Language Pathology Dysphagia Treatment Patient Details Name: Shannon Stokes MRN: [id number] DOB: 1916/12/25 Today's Date: 11/28/2012 Time: 4098-1191 SLP Time Calculation (min): 15 min  Assessment / Plan / Recommendation Clinical Impression  Pt seen to assess tolerance of po diet, puree/nectar, and readiness for dietary advancement.  Intake has been POOR and pt states she doesn't eat much at home. Pt requested cola that SLP thickened for pt and she consumed approx 2 ounces soda and 4 bites of magic cup requring much encouragment.  Pt needed max cues to conduct cough to clear pharynx, but min cueing for dry swallows- moderate overall.    Rec continue diet with strict precautions and recommend dietician consult to help maximize pt's intake on her restricted diet.    SLP to follow for advancement readiness - including readiness for repeat MBS given pt's silent nature of her dysphagia due to her cva.        Diet Recommendation  Continue with Current Diet: Dysphagia 1 (puree);Nectar-thick liquid    SLP Plan Continue with current plan of care   Pertinent Vitals/Pain Afebrile,decreased   Swallowing Goals  SLP Swallowing Goals Swallow Study Goal #2 - Progress: Progressing toward goal  General Temperature Spikes Noted: No Respiratory Status: Room air Behavior/Cognition: Alert;Cooperative;Confused;Requires cueing;Decreased sustained attention;Hard of hearing Oral Cavity - Dentition: Adequate natural dentition Patient Positioning: Upright in bed  Oral Cavity - Oral Hygiene Does patient have any of the following "at risk" factors?: Saliva - thick, dry mouth;Nutritional status - inadequate;Other - dysphagia;Diet - patient on thickened liquids;Nutritional status - dependent feeder Patient is AT RISK - Oral Care Protocol followed (see row info): Yes   Dysphagia Treatment Treatment focused on: Skilled observation of diet tolerance;Patient/family/caregiver education;Utilization of compensatory  strategies Family/Caregiver Educated: pt Treatment Methods/Modalities: Skilled observation Patient observed directly with PO's: Yes Type of PO's observed: Dysphagia 1 (puree);Nectar-thick liquids Feeding: Needs assist (can hold her own cup with mitt removed) Liquids provided via: Cup;Teaspoon Pharyngeal Phase Signs & Symptoms: Multiple swallows Type of cueing: Verbal Amount of cueing: Moderate   GO     Donavan Burnet, MS [facility] SLP [phone]

## 2012-11-28 NOTE — Progress Notes (Signed)
Physical Therapy Treatment Patient Details Name: Shannon Stokes MRN: [id number] DOB: Sep 29, 1916 Today's Date: 11/28/2012 Time: 1191-4782 PT Time Calculation (min): 38 min  PT Assessment / Plan / Recommendation  History of Present Illness 79 yo F with baseline dementia found down at home by [facility] with new evidence of L cerebellar stroke on CT and L sided facial droop and weakness.   PT Comments   Pt is progressing well with her therapy, able to tolerate sitting longer today and able to stand multiple times for peri care.  She is appropriate for SNF level rehab at discharge.    Follow Up Recommendations  SNF     Does the patient have the potential to tolerate intense rehabilitation    NA  Barriers to Discharge   None      Equipment Recommendations  None recommended by PT    Recommendations for Other Services   None  Frequency Min 3X/week   Progress towards PT Goals Progress towards PT goals: Progressing toward goals  Plan Frequency needs to be updated    Precautions / Restrictions Precautions Precautions: Fall   Pertinent Vitals/Pain See vitals flow sheet.    Mobility  Bed Mobility Supine to Sit: 2: Max assist Sitting - Scoot to Edge of Bed: 2: Max assist Details for Bed Mobility Assistance: Pt needed help to progress legs to EOB and was able to help pull up with bil arms to get to sitting.   Transfers Sit to Stand: 1: +2 Total assist;From elevated surface;With upper extremity assist;From bed Sit to Stand: Patient Percentage: 50% Stand to Sit: 1: +2 Total assist;With upper extremity assist;To elevated surface;To bed Stand to Sit: Patient Percentage: 50% Stand Pivot Transfers: 1: +2 Total assist;From elevated surface;With armrests Stand Pivot Transfers: Patient Percentage: 50% Details for Transfer Assistance: Pt needed assist to support trunk in standing. Forward flexed posture and unable to take steps around.  Physical assist to turn body to get to recliner chair.  Pt was  able to reach for railing to try to help support her body with her right upper extremity.   Ambulation/Gait Ambulation/Gait Assistance: Not tested (comment) (pt is not ready to try gait.  ) Modified Rankin (Stroke Patients Only) Pre-Morbid Rankin Score: No symptoms Modified Rankin: Severe disability      PT Goals (current goals can now be found in the care plan section) Acute Rehab PT Goals Patient Stated Goal: Unable to state due to cognition  Visit Information  Last PT Received On: 11/28/12 Assistance Needed: +2 History of Present Illness: 79 yo F with baseline dementia found down at home by [facility] with new evidence of L cerebellar stroke on CT and L sided facial droop and weakness.    Subjective Data  Subjective: Pt awake, alert, not oriented, but very observant of her surroundings.   Patient Stated Goal: Unable to state due to cognition   Cognition  Cognition Arousal/Alertness: Awake/alert Behavior During Therapy: WFL for tasks assessed/performed Overall Cognitive Status: Impaired/Different from baseline Area of Impairment: Orientation;Attention;Memory;Safety/judgement;Awareness;Problem solving Orientation Level: Disoriented to;Person;Place;Time;Situation Current Attention Level: Focused Memory: Decreased short-term memory Following Commands: Follows one step commands consistently Safety/Judgement: Decreased awareness of safety;Decreased awareness of deficits Awareness: Intellectual Problem Solving: Slow processing;Difficulty sequencing;Requires verbal cues;Requires tactile cues General Comments: Pt continues to have memory issued, can't recall husband's name or where she met him.  Did not know she was at [place], could not tell me which way she was leaning in sitting.      Balance  Balance  Balance Assessed: Yes Static Sitting Balance Static Sitting - Balance Support: Bilateral upper extremity supported;Feet supported Static Sitting - Level of Assistance: 3: Mod  assist Static Sitting - Comment/# of Minutes: mod to max assist to maintain sitting balance EOB.  Pt is leaning posteriorly and to the right.  Pt unable to report that she is losing her balance in sitting.  She is able to look out the window of her room and see cars going by and tell me what color they are (great distance vision).  Interestingly enough, after standing x 2 for toileting she was able to sit with supervision x 5 mins.   Static Standing Balance Static Standing - Balance Support: Bilateral upper extremity supported Static Standing - Level of Assistance: 1: +2 Total assist;Patient percentage (comment) (pt 50%) Static Standing - Comment/# of Minutes: pt 50%.  Stood with two person assist while RN preformed peri care dut to incontinent bowel episode.  Pt was able to tolerate standing ~2 mins with flexed posture.    End of Session PT - End of Session Equipment Utilized During Treatment: Gait belt Activity Tolerance: Patient limited by fatigue Patient left: in chair;with call bell/phone within reach     [place] B. Destiney Sanabia, PT, DPT [phone]   11/28/2012, 3:20 PM

## 2012-11-28 NOTE — Discharge Summary (Signed)
FMTS Attending Admission Note: Shannon Arras,MD I  have seen and examined this patient, reviewed their chart. I have discussed this patient with the resident. I agree with the resident's findings, assessment and care plan.  Will also benefit from PT at [facility].

## 2012-11-28 NOTE — Progress Notes (Signed)
CSW informed by pt friend, Chanetta Marshall, that he would like to proceed with dc to [facility] and that he has spoken with the facility as well. CSW notified facility. Facility admissions to contact pt friend to complete paperwork. CSW notified MD that dc summary needs to be sent to facility by 3pm. CSW will complete dc packet and arrange transport when appropriate.  Shevonne Wolf, LCSWA [phone]

## 2012-11-28 NOTE — Progress Notes (Signed)
D/c orders received: IV removed with gauze on, pt remains in stable condition, pt meds and instructions reviewed with pts caregiver, Chanetta Marshall; pt d/c to [facility], taken via [facility]; report called to [facility]

## 2012-11-30 ENCOUNTER — Telehealth: Payer: Self-pay | Admitting: Neurology

## 2012-11-30 DIAGNOSIS — R410 Disorientation, unspecified: Secondary | ICD-10-CM | POA: Insufficient documentation

## 2012-11-30 NOTE — Progress Notes (Signed)
I have seen and examined this patient on 11/29/12. I have discussed with Dr Konrad Dolores.  I agree with their findings and plans as documented in their admission note.

## 2012-12-04 ENCOUNTER — Telehealth: Payer: Self-pay | Admitting: Family Medicine

## 2012-12-04 ENCOUNTER — Ambulatory Visit: Payer: Self-pay | Admitting: Neurology

## 2012-12-04 NOTE — Telephone Encounter (Signed)
[facility] Family Medicine Residency After Hours Line.   [facility] RN states patient was found on the ground in front of her wheelchair where she was last seen. Oriented and spoke with RN as soon as came in the room and states "im going home". RN did neurological exam and stated no deficits. Patient was alert and was oriented at her baseline.   Of note, patient on eliquis, RN stated no bruises or bumps on entire head (and also none on her body). Not throwing up. Not complaining of headache.   To administer q2 neuro exams for 4 hours then space out to q4. Family medicine team will follow up in AM. If any changes on neuro exams, patient will be transported to ED.   Aldine Contes. Marti Sleigh, MD, PGY3 12/04/2012 6:06 PM

## 2012-12-05 ENCOUNTER — Non-Acute Institutional Stay: Payer: Medicare Other | Admitting: Family Medicine

## 2012-12-05 DIAGNOSIS — W19XXXA Unspecified fall, initial encounter: Secondary | ICD-10-CM | POA: Insufficient documentation

## 2012-12-05 DIAGNOSIS — Z711 Person with feared health complaint in whom no diagnosis is made: Secondary | ICD-10-CM

## 2012-12-05 NOTE — Assessment & Plan Note (Signed)
A: presumed fall from low height bed to floor per history. No neurological deficits. No fractures or bleeding appreciated. Patient is on apixaban due to paroxysmal Afib.  P: D/c q 4 hr neuro checks Continue current medications and care plan.

## 2012-12-05 NOTE — Telephone Encounter (Signed)
Patient seen my me this AM. Please see NH encounter for documentation. Of note, keflex stop date was 12/02/12. Keflex removed from med list.

## 2012-12-05 NOTE — Progress Notes (Signed)
  Subjective:    Patient ID: Shannon Stokes, female    DOB: Sep 02, 1916, 79 y.o.   MRN: [id number]  HPI 79 yo F nursing home patient seen by attending today due to report of being found sitting on her floor at the bedside yesterday. No bruising or bleeding noted. She denies MSK soreness today. She cannot recall the details of the incident. She was found sitting in her wheelchair dressed for the day in the hall. She was examined in her bedroom.   Review of Systems As per HPI     Objective: BP 165/75  Pulse 69  Temp(Src) 98.9 F (37.2 C)  Resp 18  Wt 152 lb 12.8 oz (69.31 kg)  BMI 26.22 kg/m2  SpO2 97% CBG 98   Physical Exam  Constitutional: She appears well-developed and well-nourished. No distress.  HENT:  Head: Normocephalic and atraumatic.  Eyes: EOM are normal.  Cardiovascular: Normal rate, regular rhythm and normal heart sounds.   Pulmonary/Chest: Effort normal and breath sounds normal.  Musculoskeletal: She exhibits no tenderness.  Neurological: She is alert. No cranial nerve deficit. She exhibits normal muscle tone.  Skin: Skin is warm and dry. No bruising and no ecchymosis noted.      Assessment & Plan:

## 2012-12-15 ENCOUNTER — Telehealth: Payer: Self-pay | Admitting: Family Medicine

## 2012-12-15 NOTE — Telephone Encounter (Signed)
Shannon Stokes would like a call to discuss Shannon Stokes and what she is and isn't capable of doing.

## 2012-12-15 NOTE — Telephone Encounter (Signed)
Will attempt to call Mr. Shannon Stokes prior to leaving town this afternoon, though may need to document our conversation as a late-entry note. If I can't reach him, I will leave him a message and call him back early next week after I get back from retreat. Thanks. --CMS

## 2012-12-17 NOTE — Telephone Encounter (Signed)
Late entry of phone conversation. Spoke with [name] on 9/5 around 4PM. Discussed with him pt's current status and that [name] feels pt is adjusting to being at [facility] and seems decently well-cared for. He expressed that he feels very doubtful that pt will ever be able to return home alone, but that she seems to be doing well with physical therapy and he is hopeful that (if she should be/remain appropriate to be a long-term resident) at [facility] that she will be able to stay (in terms of payor source, etc). Will plan to see Shannon Stokes in the next one to two weeks and will touch base with [name] after that visit. Encourged [name] to call at any time with questions or concerns. --CMS

## 2012-12-20 ENCOUNTER — Telehealth: Payer: Self-pay | Admitting: Nurse Practitioner

## 2012-12-20 NOTE — Telephone Encounter (Signed)
Call pt to remind her about her appt. No answer and no answering machine.

## 2012-12-21 ENCOUNTER — Ambulatory Visit (INDEPENDENT_AMBULATORY_CARE_PROVIDER_SITE_OTHER): Payer: Medicare Other | Admitting: Nurse Practitioner

## 2012-12-21 VITALS — BP 132/58 | HR 61 | Temp 97.9°F

## 2012-12-21 DIAGNOSIS — I4891 Unspecified atrial fibrillation: Secondary | ICD-10-CM

## 2012-12-21 DIAGNOSIS — I6350 Cerebral infarction due to unspecified occlusion or stenosis of unspecified cerebral artery: Secondary | ICD-10-CM

## 2012-12-21 DIAGNOSIS — Z9181 History of falling: Secondary | ICD-10-CM

## 2012-12-21 DIAGNOSIS — G8110 Spastic hemiplegia affecting unspecified side: Secondary | ICD-10-CM

## 2012-12-21 DIAGNOSIS — F0280 Dementia in other diseases classified elsewhere without behavioral disturbance: Secondary | ICD-10-CM

## 2012-12-21 DIAGNOSIS — I639 Cerebral infarction, unspecified: Secondary | ICD-10-CM

## 2012-12-21 DIAGNOSIS — R269 Unspecified abnormalities of gait and mobility: Secondary | ICD-10-CM

## 2012-12-21 DIAGNOSIS — R2681 Unsteadiness on feet: Secondary | ICD-10-CM

## 2012-12-21 NOTE — Progress Notes (Signed)
[facility]  PATIENT: Marcheta Grammes DOB: [birth year]   HISTORY FROM: patient, chart REASON FOR VISIT: routine follow up  HISTORY OF PRESENT ILLNESS:  Hazelyn Kallen is a 79 y.o. year old female presenting with left sided hemiparesis suspicious for stroke confirmed on MRI 8/16. She was found to have left sided hemiparesis with + babinski on the left foot. She was also found to have a WBC 17. 8 with suspected UTI. She had complaints with burning on urination. Received Rocephin in the ED. PMH is significant for falls and questionable prior CVA per imaging from 3 years ago. Baseline MMSE 16/30 by Dr. Casper Harrison as an outpt within the past couple of months, recently started Aricept.  New diagnosis of atrial fibrillation, was started on Apixiban. Patient was discharged to nursing home. She is divorced, no children, has no family in the area. Has long-time neighbor who is  HCPOA, August Luz.  UPDATE 12/21/12 (LL):  Patient is brought to office for stroke follow up from [facility].  Per chart, she had a fall, uninjured, at the nursing home on 12/05/12.  [name], a longtime family friend is with her at this visit.  She is pleasantly demented, her friend states that her memory loss has progressed since this last hospital admission.  She is doing quite well, participating in PT twice a week and is able to ambulate short distances.    REVIEW OF SYSTEMS: Full 14 system review of systems performed and notable only for: constitutional: N/A  cardiovascular: N/A respiratory: N/A endocrine: N/A  ear/nose/throat: trouble swallowing Hematology/Lymph: N/A musculoskeletal: N/A skin: N/A genitourinary: incontinence Gastrointestinal:  incontinence allergy/immunology: N/A neurological: memory loss, confusion sleep: N/A psychiatric: N/A   ALLERGIES: No Known Allergies  HOME MEDICATIONS: Outpatient Prescriptions Prior to Visit  Medication Sig Dispense Refill  . apixaban  (ELIQUIS) 5 MG TABS tablet Take 1 tablet (5 mg total) by mouth 2 (two) times daily.  60 tablet  0  . atorvastatin (LIPITOR) 40 MG tablet Take 1 tablet (40 mg total) by mouth daily.      Marland Kitchen donepezil (ARICEPT) 5 MG tablet Take 1 tablet (5 mg total) by mouth at bedtime as needed.  90 tablet  1  . ergocalciferol (DRISDOL) 50000 UNITS capsule Take 50,000 Units by mouth once a week.        . metoprolol tartrate (LOPRESSOR) 12.5 mg TABS tablet Take 0.5 tablets (12.5 mg total) by mouth 2 (two) times daily.      . Multiple Vitamin (MULTIVITAMINS PO) Take 1 tablet by mouth daily.        . QUEtiapine (SEROQUEL) 25 MG tablet Take 1 tablet (25 mg total) by mouth 2 (two) times daily as needed (agitation).       No facility-administered medications prior to visit.    PAST MEDICAL HISTORY: Past Medical History  Diagnosis Date  . Hypertension   . Stroke     PAST SURGICAL HISTORY: Past Surgical History  Procedure Laterality Date  . Appendectomy    . Ankle surgery      "as a child"    FAMILY HISTORY: No family history on file.  SOCIAL HISTORY: History   Social History  . Marital Status: Single    Spouse Name: N/A    Number of Children: N/A  . Years of Education: N/A   Occupational History  . Not on file.   Social History Main Topics  . Smoking status: Never Smoker   . Smokeless tobacco: Not on file  .  Alcohol Use: No  . Drug Use: Not on file  . Sexual Activity: No   Other Topics Concern  . Not on file   Social History Narrative   -Pt lives alone at home, with no family in the area (pt never had children, divorced her husband many years ago, etc).    -Pt feels safe and able to manage herself at home.    -Pt is a never smoker, and denies EtOH or other illicit substance use.    -Mr. Lucilla Edin is pt's HCPOA [facility] [phone], Cell [phone])   -Pt has a form stating her "natural death desires," which in summary states that she would not want life-prolonging measures in the  event of a terminal/vegetative state.    -On clarification, pt states she would however want normal therapies initiated as needed in the event of acute illness, up to and including CPR, intubation, and artificial respiration.           PHYSICAL EXAM  Filed Vitals:   12/21/12 1544  BP: 132/58  Pulse: 61  Temp: 97.9 F (36.6 C)  TempSrc: Oral   Cannot calculate BMI with a height equal to zero.  Generalized: In no acute distress, frail elderly female, pleasant  Neck: Supple, no carotid bruits   Cardiac: regular rate and rhythm, 2/6 systolic murmur   Pulmonary: Clear to auscultation bilaterally   Musculoskeletal: No deformity   Neurological examination   Mentation: Alert to self, pleasant, language fluent, diminished attention and recall.  Cranial nerve II-XII: Pupils were equal round reactive to light extraocular movements were full, visual field were full on confrontational test. facial sensation and strength were normal. hearing was decreased bilaterally.  MOTOR: normal bulk and tone, Moves all 4 extremities but left-sided less than right. Mild weakness of left grip and hip flexors. Fine finger movements diminished on the left, no pronator drift SENSORY: normal and symmetric to light touch COORDINATION: finger-nose-finger normal REFLEXES:  GAIT/STATION: Gait deferred, patient in wheelchair.   DIAGNOSTIC DATA (LABS, IMAGING, TESTING) - I reviewed patient records, labs, notes, testing and imaging myself where available.  Lab Results  Component Value Date   WBC 10.2 11/27/2012   HGB 13.1 11/27/2012   HCT 38.6 11/27/2012   MCV 86.7 11/27/2012   PLT 183 11/27/2012      Component Value Date/Time   NA 138 11/26/2012 0755   K 3.9 11/26/2012 0755   CL 108 11/26/2012 0755   CO2 22 11/26/2012 0755   GLUCOSE 141* 11/26/2012 0755   BUN 13 11/26/2012 0755   CREATININE 0.81 11/26/2012 0755   CALCIUM 9.2 11/26/2012 0755   PROT 6.1 11/23/2012 2145   ALBUMIN 3.0* 11/23/2012 2145   AST  34 11/23/2012 2145   ALT 10 11/23/2012 2145   ALKPHOS 70 11/23/2012 2145   BILITOT 0.7 11/23/2012 2145   GFRNONAA 60* 11/26/2012 0755   GFRAA 70* 11/26/2012 0755   Lab Results  Component Value Date   CHOL 151 11/24/2012   HDL 67 11/24/2012   LDLCALC 70 11/24/2012   TRIG 70 11/24/2012   CHOLHDL 2.3 11/24/2012   Lab Results  Component Value Date   HGBA1C 5.8* 11/24/2012   No results found for this basename: VITAMINB12   Lab Results  Component Value Date   TSH 1.268 11/26/2012   CT Cervical Spine 11/23/2012 No evidence of significant acute traumatic injury to the cervical spine.  CT of the brain 11/23/2012  Ill-defined area of low attenuation in the left cerebellar hemisphere is  new compared to prior studies and may represent age indeterminate ischemia. This could be further evaluated with MRI of the brain if clinically indicated. Encephalomalacia in the left occipital region related to an old infarction. Moderate cerebral and cerebellar atrophy with extensive chronic microvascular ischemic changes in the cerebral white matter  MRI of the brain 11/25/12 12 mm acute infarction within the right pons. No evidence of acute cerebellar infarct. Extensive chronic ischemic changes elsewhere throughout the brain. MRA of the brain 11/25/12 The left vertebral artery is occluded. There is retrograde flow to pica. This finding is new since the study of 2011  2D Echocardiogram 55% to 60%. Doppler parameters are consistent with abnormal left ventricular relaxation (grade 1 diastolic dysfunction). Carotid Doppler No evidence of hemodynamically significant internal carotid artery stenosis. Vertebral artery flow is antegrade.  ASSESSMENT AND PLAN Naleyah Ohlinger is a 79 y.o. year old female with baseline dementia with mild left sided hemiparesis suspicious for stroke confirmed on MRI 11/25/12. She was found to have left sided hemiparesis with + babinski on the left foot. She was also found to have a WBC 17. 8, with UTI.   PMH is significant for falls and questionable prior CVA per imaging from 3 years ago.    Continue Apixiban for atrial fibrillation.  Maintain strict control of hypertension with blood pressure goal below 130/90, and lipids with LDL cholesterol goal below 100 mg/dL.  Followup in only as needed.   Johniece Hornbaker NP-C 12/21/2012, 4:22 PM [facility] [address] [place], [place] [address] [phone]

## 2012-12-21 NOTE — Patient Instructions (Signed)
Continue Apixiban  for secondary stroke prevention and maintain strict control of hypertension with blood pressure goal below 130/90, diabetes with hemoglobin A1c goal below 6.5% and lipids with LDL cholesterol goal below 100 mg/dL.   No follow up appointment needed.     STROKE/TIA INSTRUCTIONS SMOKING Cigarette smoking nearly doubles your risk of having a stroke & is the single most alterable risk factor  If you smoke or have smoked in the last 12 months, you are advised to quit smoking for your health.  Most of the excess cardiovascular risk related to smoking disappears within a year of stopping.  Ask you doctor about anti-smoking medications  [place] Quit Line: 1-800-QUIT NOW  Free Smoking Cessation Classes ([phone]  CHOLESTEROL Know your levels; limit fat & cholesterol in your diet  Lab Results  Component Value Date   CHOL 151 11/24/2012   HDL 67 11/24/2012   LDLCALC 70 11/24/2012   TRIG 70 11/24/2012   CHOLHDL 2.3 11/24/2012      Many patients benefit from treatment even if their cholesterol is at goal.  Goal: Total Cholesterol less than 160  Goal:  LDL less than 100  Goal:  HDL greater than 40  Goal:  Triglycerides less than 150  BLOOD PRESSURE American Stroke Association blood pressure target is less that 120/80 mm/Hg  Your discharge blood pressure is:  BP: 132/58 mmHg  Monitor your blood pressure  Limit your salt and alcohol intake  Many individuals will require more than one medication for high blood pressure  DIABETES (A1c is a blood sugar average for last 3 months) Goal A1c is under 7% (A1c is blood sugar average for last 3 months)  Diabetes: No known diagnosis of diabetes    Lab Results  Component Value Date   HGBA1C 5.8* 11/24/2012    Your A1c can be lowered with medications, healthy diet, and exercise.  Check your blood sugar as directed by your physician  Call your physician if you experience unexplained or low blood sugars.  PHYSICAL  ACTIVITY/REHABILITATION Goal is 30 minutes at least 4 days per week    Activity decreases your risk of heart attack and stroke and makes your heart stronger.  It helps control your weight and blood pressure; helps you relax and can improve your mood.  Participate in a regular exercise program.  Talk with your doctor about the best form of exercise for you (dancing, walking, swimming, cycling).  DIET/WEIGHT Goal is to maintain a healthy weight  Your height is:  Height:  (wheelchair) Your current weight is: Weight:  (wheelchair) Your body Mass Index (BMI) is:     Following the type of diet specifically designed for you will help prevent another stroke.  Your goal Body Mass Index (BMI) is 19-24.  Healthy food habits can help reduce 3 risk factors for stroke:  High cholesterol, hypertension, and excess weight.

## 2012-12-22 ENCOUNTER — Non-Acute Institutional Stay (INDEPENDENT_AMBULATORY_CARE_PROVIDER_SITE_OTHER): Payer: Medicare Other | Admitting: Family Medicine

## 2012-12-22 DIAGNOSIS — W19XXXA Unspecified fall, initial encounter: Secondary | ICD-10-CM

## 2012-12-22 DIAGNOSIS — F068 Other specified mental disorders due to known physiological condition: Secondary | ICD-10-CM

## 2012-12-22 DIAGNOSIS — I10 Essential (primary) hypertension: Secondary | ICD-10-CM

## 2012-12-22 DIAGNOSIS — G8190 Hemiplegia, unspecified affecting unspecified side: Secondary | ICD-10-CM

## 2012-12-22 DIAGNOSIS — I6350 Cerebral infarction due to unspecified occlusion or stenosis of unspecified cerebral artery: Secondary | ICD-10-CM

## 2012-12-22 DIAGNOSIS — I480 Paroxysmal atrial fibrillation: Secondary | ICD-10-CM

## 2012-12-22 DIAGNOSIS — W19XXXD Unspecified fall, subsequent encounter: Secondary | ICD-10-CM

## 2012-12-22 DIAGNOSIS — I639 Cerebral infarction, unspecified: Secondary | ICD-10-CM

## 2012-12-22 DIAGNOSIS — I4891 Unspecified atrial fibrillation: Secondary | ICD-10-CM

## 2012-12-22 NOTE — Assessment & Plan Note (Addendum)
Recently diagnosed in August, in pons. Doing well with PT/OT. Continue apixaban, Lipitor. Plan to continue therapy as long as pt continues to improve for strengthening/conditioning and rehab. Consider CMP/lipid panel since Lipitor started after SNF admission. Seen at neurologist 9/11 with recommendation to continue apixiban and BP control (only on metoprolol but BP has been less elevated than at previous outpt visits).

## 2012-12-22 NOTE — Assessment & Plan Note (Signed)
Appears worsened since last outpt visits earlier this year but relatively stable since hospitalization in August. Continue Aricept.

## 2012-12-22 NOTE — Assessment & Plan Note (Signed)
Secondary to pons stroke. Doing well with therapy. Continue strengthening/conditioning exercises/rehab.

## 2012-12-22 NOTE — Assessment & Plan Note (Signed)
Rate-controlled with metoprolol and on apixaban. Continue both.

## 2012-12-22 NOTE — Assessment & Plan Note (Signed)
Presumed/unwitnessed a few weeks ago; unsteady gait at previous baseline and with history of stroke and persistent left-sided weakness. No obvious sequelae. Continue current mediations and care.

## 2012-12-22 NOTE — Progress Notes (Signed)
  Subjective:    Patient ID: Shannon Stokes, female    DOB: May 15, 1916, 79 y.o.   MRN: [id number]  HPI: Pt seen at [facility] while at physical therapy. Pt in no acute distress but did not recognize me. States she feels well. No current complaints of pain. States she is eating well, moving her bowels well.  Recent stroke - On apixaban, and started Lipitor recently at [facility] admission. Doing well with PT/OT. Per PT/OT providers, probably another several weeks of therapy. Recent fall at nursing home - denies pain or further falls. Does not remember falling. Afib - rate-controlled on metoprolol and also on apixiban as above. Denies chest pain or SOB. Dementia - pleasantly demented; did not recognize me as above. Per PT/OT providers, working memory makes therapy difficult but short-term memory is "okay."  I discussed pt's current care with [name], [name] on 9/5, who had no acute questions/concerns, other than wanting pt to continue with therapy as long as is appropriate. In addition to the above documentation, pt's PMH, surgical history, FH, and SH all reviewed and updated where appropriate in the EMR. I have also reviewed and updated the pt's allergies and current medications as appropriate.  Review of Systems: As above. Limited by dementia but no acute complaints.      Objective:   Physical Exam BP 127/57  Pulse 79  Temp(Src) 97.8 F (36.6 C)  Resp 16 (measured 9/7) Gen: elderly female in NAD, sitting in wheelchair Cardio: rate regular, rhythm irregularly irregular Pulm: CTAB, normal WOB Abd: soft, nontender, BS+ Ext: strength 5/5 on right, 4+/5 on left in both upper and lower limbs  No pedal edema Neuro/Psych: mood euthymic, affect congruent, very pleasant  Oriented to self but not time/place     Assessment & Plan:

## 2012-12-22 NOTE — Assessment & Plan Note (Signed)
Noted as an outpt prior to stroke, now on metoprolol only (for rate control of afib). BP goal 130/90 per neurologist consultants. Monitor regularly.

## 2013-01-02 ENCOUNTER — Encounter: Payer: Self-pay | Admitting: Student-PharmD

## 2013-01-18 ENCOUNTER — Other Ambulatory Visit: Payer: Self-pay | Admitting: Family Medicine

## 2013-01-18 DIAGNOSIS — F068 Other specified mental disorders due to known physiological condition: Secondary | ICD-10-CM

## 2013-01-18 MED ORDER — DONEPEZIL HCL 5 MG PO TABS: 10.0000 mg | ORAL_TABLET | Freq: Every day | ORAL | Status: DC

## 2013-02-12 ENCOUNTER — Non-Acute Institutional Stay: Payer: Medicare Other | Admitting: Family Medicine

## 2013-02-12 DIAGNOSIS — I480 Paroxysmal atrial fibrillation: Secondary | ICD-10-CM

## 2013-02-12 DIAGNOSIS — I4891 Unspecified atrial fibrillation: Secondary | ICD-10-CM

## 2013-02-12 DIAGNOSIS — Z9181 History of falling: Secondary | ICD-10-CM

## 2013-02-12 DIAGNOSIS — I10 Essential (primary) hypertension: Secondary | ICD-10-CM

## 2013-02-12 NOTE — Assessment & Plan Note (Signed)
A: no recent falls.  P: monitor. Encourage use of wheelchair and assistance with transfers.

## 2013-02-12 NOTE — Assessment & Plan Note (Signed)
Rate controlled off metoprolol. Will monitor with plans to restart metoprolol as needed.

## 2013-02-12 NOTE — Assessment & Plan Note (Signed)
A: on eliquis and tolerating this well. No recent bleeds. In sinus rhythm currently. P: continue eliquis.

## 2013-02-12 NOTE — Progress Notes (Signed)
Attending Progess Note  Subjective:    Patient ID: Shannon Stokes, female    DOB: 1916-10-19, 79 y.o.   MRN: [id number]  HPI 79 yo F seen for routine. Patient found sitting in wheelchair nearing nurses station. She has no complaints. She reports eating "fairly well". She denies CP, SOB, GI upset.   Acute events: none.    Review of Systems As per HPI     Objective:   Physical Exam BP 136/69  Pulse 67  Temp(Src) 97 F (36.1 C)  Resp 20  Wt 148 lb (67.132 kg) Wt Readings from Last 3 Encounters:  01/22/13 148 lb (67.132 kg)  11/29/12 152 lb 12.8 oz (69.31 kg)  11/24/12 150 lb 6.4 oz (68.221 kg)  General appearance: alert, cooperative and no distress Head: Normocephalic, without obvious abnormality, atraumatic Lungs: clear to auscultation bilaterally Heart: regular rate and rhythm, S1, S2 normal, no murmur, click, rub or gallop Abdomen: soft, non-tender; bowel sounds normal; no masses,  no organomegaly Extremities: extremities normal, atraumatic, no cyanosis or edema     Assessment & Plan:

## 2013-02-16 ENCOUNTER — Encounter: Payer: Self-pay | Admitting: Family Medicine

## 2013-02-16 ENCOUNTER — Non-Acute Institutional Stay (INDEPENDENT_AMBULATORY_CARE_PROVIDER_SITE_OTHER): Payer: Medicare Other | Admitting: Family Medicine

## 2013-02-16 DIAGNOSIS — W19XXXD Unspecified fall, subsequent encounter: Secondary | ICD-10-CM

## 2013-02-16 DIAGNOSIS — F068 Other specified mental disorders due to known physiological condition: Secondary | ICD-10-CM

## 2013-02-16 DIAGNOSIS — I6990 Unspecified sequelae of unspecified cerebrovascular disease: Secondary | ICD-10-CM

## 2013-02-16 DIAGNOSIS — W19XXXA Unspecified fall, initial encounter: Secondary | ICD-10-CM

## 2013-02-16 DIAGNOSIS — I4891 Unspecified atrial fibrillation: Secondary | ICD-10-CM

## 2013-02-16 DIAGNOSIS — I480 Paroxysmal atrial fibrillation: Secondary | ICD-10-CM

## 2013-02-16 DIAGNOSIS — I10 Essential (primary) hypertension: Secondary | ICD-10-CM

## 2013-02-16 DIAGNOSIS — I6930 Unspecified sequelae of cerebral infarction: Secondary | ICD-10-CM

## 2013-02-16 NOTE — Assessment & Plan Note (Signed)
Unwitnessed, earlier in August this year. No obvious neurologic change (dementia clouds mental status), and no further falls. Continue PT/OT, medications, current care.
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
## 2013-02-16 NOTE — Assessment & Plan Note (Signed)
BP and rate within normal/goal range, off metoprolol. Monitor vitals routinely, consider restart metoprolol as needed.

## 2013-02-16 NOTE — Assessment & Plan Note (Signed)
Rate and rhythm currently within normal ranges, off metoprolol. On Eliquis without evidence of bleeding. Continue Eliquis, monitor vitals regularly.

## 2013-02-16 NOTE — Assessment & Plan Note (Signed)
Recently diagnosed in August 2014, in pons. Remains getting PT/OT. No new frank neurologic changes but concomitant dementia makes it difficult to note whether there is any acute mental status change or not. Continue Eliquis and Lipitor.

## 2013-02-25 ENCOUNTER — Telehealth: Payer: Self-pay | Admitting: Sports Medicine

## 2013-02-25 MED ORDER — LORAZEPAM 2 MG/ML IJ SOLN: 0.5000 mg | Freq: Once | INTRAMUSCULAR | Status: DC | PRN
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
## 2013-02-27 ENCOUNTER — Non-Acute Institutional Stay (INDEPENDENT_AMBULATORY_CARE_PROVIDER_SITE_OTHER): Payer: Medicare Other | Admitting: Emergency Medicine

## 2013-02-27 DIAGNOSIS — F068 Other specified mental disorders due to known physiological condition: Secondary | ICD-10-CM

## 2013-02-27 NOTE — Assessment & Plan Note (Signed)
Having worsening of aggressive behavior and agitation. Some concern for paranoia given my interaction with her. QTc okay at 423 in 11/2012. Started seroquel 25mg  qHS, with an additional 25mg  prn 30 minutes later. Would prefer to avoid benzodiazepines given her age and dementia.  Will continue to monitor.

## 2013-02-27 NOTE — Progress Notes (Signed)
Patient ID: Shannon Stokes, female   DOB: 12/02/1916, 79 y.o.   MRN: [id number]  Subjective:  I attempted to speak with the patient.  She declined to speak with me stating that she did not think I was a doctor, that it was none of my business, or she didn't think that was necessary.  Nursing has reported increased aggression and agitation along with some wandering behavior, especially at night.  Per nursing, she has not been sleeping well.  Objective Patient declined exam. Was sitting comfortably in her wheelchair, in no obvious distress  Assessment: 79 yo woman with dementia, stroke, HTN who is having increased intermittent agitation and aggression.  Plan: See problem list.

## 2013-02-28 ENCOUNTER — Non-Acute Institutional Stay: Payer: Medicare Other | Admitting: Family Medicine

## 2013-02-28 DIAGNOSIS — I10 Essential (primary) hypertension: Secondary | ICD-10-CM

## 2013-02-28 DIAGNOSIS — I480 Paroxysmal atrial fibrillation: Secondary | ICD-10-CM

## 2013-02-28 DIAGNOSIS — I4891 Unspecified atrial fibrillation: Secondary | ICD-10-CM

## 2013-02-28 DIAGNOSIS — G4701 Insomnia due to medical condition: Secondary | ICD-10-CM

## 2013-03-01 DIAGNOSIS — G4701 Insomnia due to medical condition: Secondary | ICD-10-CM | POA: Insufficient documentation

## 2013-03-01 NOTE — Assessment & Plan Note (Signed)
In sinus rhythm currently.  Continue eliquis.

## 2013-03-01 NOTE — Assessment & Plan Note (Addendum)
bp well controlled No medications needed.

## 2013-03-01 NOTE — Assessment & Plan Note (Signed)
A: insomnia related to dementia. No evidence of acute infection or pain contributing to insomnia.  P:  Agree with resident's plan from yesterday Patient taking remeron 7.5 mg q HS  Added seroquel 25050 mg qHS Avoid BZ given patient elderly with dementia due to anticholinergic side effects.

## 2013-03-01 NOTE — Progress Notes (Signed)
NH attending Note  Subjective:    Patient ID: Shannon Stokes, female    DOB: March 05, 1917, 79 y.o.   MRN: [id number]  HPI 79 yo F NH resident seen for acute care f/u:  1. Insomnia: patient with reported insomnia and agitation at night. She stays up into the early morning roaming the halls in her wheelchair. Her behavior has become more aggressive and agitated per report. This AM she is in her wheelchair near the nurses station. She is pleasant and obliging. She has agreed to examination in her room. I wheel her to her room.   When we get to her room we talk for < 1 min and she announces that she is ready to leave. She agrees to let me examine her before we leave.   Review of Systems Denies CP, SOB, dysuria, Nausea. Reports sometimes sleeping well and sometimes sleeping not so well.     Objective:   Physical Exam BP 103/59  Pulse 75  Temp(Src) 98.1 F (36.7 C)  Resp 20  SpO2 92% General appearance: alert, cooperative and no distress Throat: lips, mucosa, and tongue normal; teeth and gums normal Lungs: clear to auscultation bilaterally Heart: regular rate and rhythm, S1, S2 normal, no murmur, click, rub or gallop Abdomen: soft, non-tender; bowel sounds normal; no masses,  no organomegaly Extremities: extremities normal, atraumatic, no cyanosis or edema Neurologic: alert, cooperative, no distress.  Calm with a bit of impulsivity.     Assessment & Plan:

## 2013-03-02 LAB — BASIC METABOLIC PANEL
Glucose: 123 mg/dL
Sodium: 137 mmol/L (ref 137–147)

## 2013-03-02 LAB — CBC AND DIFFERENTIAL
HCT: 32 % — AB (ref 36–46)
Hemoglobin: 10.6 g/dL — AB (ref 12.0–16.0)
Platelets: 286 10*3/uL (ref 150–399)

## 2013-03-05 ENCOUNTER — Encounter: Payer: Self-pay | Admitting: Pharmacist

## 2013-03-06 ENCOUNTER — Encounter: Payer: Self-pay | Admitting: Emergency Medicine

## 2013-04-23 ENCOUNTER — Non-Acute Institutional Stay (INDEPENDENT_AMBULATORY_CARE_PROVIDER_SITE_OTHER): Payer: Medicare Other | Admitting: Family Medicine

## 2013-04-23 ENCOUNTER — Encounter: Payer: Self-pay | Admitting: Family Medicine

## 2013-04-23 DIAGNOSIS — F03918 Unspecified dementia, unspecified severity, with other behavioral disturbance: Secondary | ICD-10-CM

## 2013-04-23 DIAGNOSIS — I10 Essential (primary) hypertension: Secondary | ICD-10-CM

## 2013-04-23 DIAGNOSIS — I6930 Unspecified sequelae of cerebral infarction: Secondary | ICD-10-CM

## 2013-04-23 DIAGNOSIS — I480 Paroxysmal atrial fibrillation: Secondary | ICD-10-CM

## 2013-04-23 DIAGNOSIS — F0391 Unspecified dementia with behavioral disturbance: Secondary | ICD-10-CM

## 2013-04-23 DIAGNOSIS — I4891 Unspecified atrial fibrillation: Secondary | ICD-10-CM

## 2013-04-23 DIAGNOSIS — I6990 Unspecified sequelae of unspecified cerebrovascular disease: Secondary | ICD-10-CM

## 2013-04-23 NOTE — Assessment & Plan Note (Signed)
Waxing/waning, worse for several months, with intermittent agitation / wandering at night. Now on scheduled Seroquel qHS plus extra dose PRN for agitation, also on Aricept, Namenda, and Remeron. Not oriented this morning other than to self and questionable if she truly recognized me but also not agitated. Continue current meds / doses, avoid benzos if any further agitation. Monitor for worsening.

## 2013-04-23 NOTE — Assessment & Plan Note (Signed)
Regular rhythm and rate, currently. Continue Eliquis. If any arrythmia / tachycaria, could consider restarting low-dose metoprolol (may also help with mild HTN out of goal range). Monitor regularly.

## 2013-04-23 NOTE — Assessment & Plan Note (Signed)
BP slightly elevated on 1/10, off meds (goal 130/90, systolic 145 1/10). Consider restarting low dose of metoprolol if BP continues out of goal range. Otherwise monitor clinically.

## 2013-04-23 NOTE — Assessment & Plan Note (Signed)
Diagnosed August 2014, in pons. No new frank neurologic changes but some possible contribution of stroke to agitation / wandering behavior / dementia in general. Continue Eliquis, Lipitor. Monitor for new acute neurological changes.

## 2013-04-23 NOTE — Progress Notes (Signed)
   Subjective:    Patient ID: Shannon Stokes, female    DOB: Dec 18, 1916, 80 y.o.   MRN: [id number]  HPI: HPI: Pt seen at bedside at [facility] for monthly visit. Pt sitting in wheelchair, getting ready for lunch. States she generally feels "pretty well." States she did not recognize me, at first, but then recognized my name when she saw my badge. Last visit with me, she was very anxious, almost frightened and talking of being mistreated (without physical signs of injury) but today she is very pleasant and cheerful.  Pons stroke and waxing/waning dementia - no new complaints of weakness. Pt has had increased agitation at times and wandering behavior at night for the past few months, now on Aricept, Nameda, and Remeron with Seroquel scheduled at bedtime with an extra dose for agitation PRN. She states she "wasn't aware" of these issues.  Afib - on Eliquis and Lipitor only; off beta blocker for several weeks. Denies palpitations or chest pain.   HTN - on no specific meds at this time, denies headache, chest pain, palpitations, LE swelling. Previously was on metoprolol for a fib, but was stopped   Pt is a never smoker.  In addition to the above documentation, pt's PMH, surgical history, FH, and SH all reviewed and updated where appropriate in the EMR.  I have also reviewed and updated the pt's allergies and current medications as appropriate.   Review of Systems: As above. Limited by dementia but denies specific pain, difficulty breathing, chest pain, abdominal pain.     Objective:   Physical Exam BP 145/72  Pulse 69  Temp(Src) 97.3 F (36.3 C)  Resp 20 (vitals taken 1/10) Gen: well-appearing elderly female, sitting up in wheelchair without distress HEENT: PERRLA, MMM  Pulm: CTAB, no wheezes Cardio: no frank murmur appreciated, rate/rhythm regular, sounds distant Abd: soft, nontender, BS+  Ext: warm, well-perfused, no LE edema  Neuro: awake / alert, but not oriented other  than to self (unsure of date / time / place, does not give specific answers)     Assessment & Plan:

## 2013-05-09 ENCOUNTER — Non-Acute Institutional Stay: Payer: Medicare Other | Admitting: Family Medicine

## 2013-05-09 DIAGNOSIS — F0390 Unspecified dementia without behavioral disturbance: Secondary | ICD-10-CM

## 2013-05-09 DIAGNOSIS — I480 Paroxysmal atrial fibrillation: Secondary | ICD-10-CM

## 2013-05-09 DIAGNOSIS — I4891 Unspecified atrial fibrillation: Secondary | ICD-10-CM

## 2013-05-09 DIAGNOSIS — I10 Essential (primary) hypertension: Secondary | ICD-10-CM

## 2013-05-09 NOTE — Assessment & Plan Note (Addendum)
Patient currently in normal sinus rhythm. Agree with resident plan to restart low-dose metoprolol if the patient has recurrence of arrhythmia. Continue Eliquis for stroke prevention.

## 2013-05-09 NOTE — Assessment & Plan Note (Signed)
Blood pressure well-controlled off all hypertensive medications. Continue current management.

## 2013-05-09 NOTE — Progress Notes (Signed)
   Subjective:    Patient ID: Shannon Stokes, female    DOB: 1916/07/27, [age] y.o.   MRN: [id number]  HPI 80 year old female with past medical history of dementia, atrial fibrillation with recent CVA in 2014, currently living in [facility]. Patient is without acute complaints. History of present illness difficult to obtain secondary to dementia. No acute complaints of pain. Patient tolerating her diet. Per nursing staff no acute issues identified. Recent agitation appears to be improved. No family members present for additional history of present illness.  Reviewed recent note completed by Dr. Maryjean [address].   Review of Systems  Constitutional: Negative for fever, chills and fatigue.  Respiratory: Negative for shortness of breath.   Cardiovascular: Negative for chest pain and leg swelling.  Gastrointestinal: Negative for nausea, vomiting, abdominal pain, diarrhea and abdominal distention.       Objective:   Physical Exam Vitals: Reviewed General: Pleasant Caucasian female, no acute distress, sitting in wheelchair, oriented to self HEENT: Pupils were equal in size bilaterally, extraocular movements are intact, no scleral icterus, moist mucous members, neck was supple, no anterior posterior cervical lymphadenopathy Cardiac regular in rhythm, S1 and S2 present, no murmurs, no heaves or thrills Respiratory: Clear to auscultation bilaterally, normal effort Abdomen: Soft, nontender Extremities: No edema, 2+ radial pulses bilaterally Neuro: Patient oriented to self, unable to answer the date or location correctly, unable to answer the name of the current President       Assessment & Plan:  Please see problem specific assessment and plan.

## 2013-05-09 NOTE — Assessment & Plan Note (Signed)
Patient has had recent waxing waning of mental status with intermittent agitation. This appears to be somewhat improved on scheduled Seroquel. Continue current regimen of Aricept, Namenda, Remeron, and Seroquel.

## 2013-05-21 ENCOUNTER — Other Ambulatory Visit: Payer: Self-pay | Admitting: Family Medicine

## 2013-07-02 ENCOUNTER — Non-Acute Institutional Stay (INDEPENDENT_AMBULATORY_CARE_PROVIDER_SITE_OTHER): Payer: Medicare Other | Admitting: Family Medicine

## 2013-07-02 ENCOUNTER — Encounter: Payer: Self-pay | Admitting: Family Medicine

## 2013-07-02 DIAGNOSIS — I6990 Unspecified sequelae of unspecified cerebrovascular disease: Secondary | ICD-10-CM

## 2013-07-02 DIAGNOSIS — I10 Essential (primary) hypertension: Secondary | ICD-10-CM

## 2013-07-02 DIAGNOSIS — I480 Paroxysmal atrial fibrillation: Secondary | ICD-10-CM

## 2013-07-02 DIAGNOSIS — I4891 Unspecified atrial fibrillation: Secondary | ICD-10-CM

## 2013-07-02 DIAGNOSIS — I6930 Unspecified sequelae of cerebral infarction: Secondary | ICD-10-CM

## 2013-07-02 DIAGNOSIS — F0390 Unspecified dementia without behavioral disturbance: Secondary | ICD-10-CM

## 2013-07-02 NOTE — Assessment & Plan Note (Signed)
Diagnosed August 2014, in pons, without new frank neurologic changes. Question contribution of stroke to dementia / wandering behavior, but overall appears stable. Strength seems improved. Continue Eliquis, Lipitor, monitor for acute new neurologic changes.

## 2013-07-02 NOTE — Assessment & Plan Note (Signed)
Currently in sinus rhythm, off metoprolol, stable on Eliquis without obvious bleeds. Pulse occasionally in high 50's, so would be reluctant to restart beta blocker unless rate is very high. Regardless, will monitor vital signs regularly and continue anticoagulation.

## 2013-07-02 NOTE — Assessment & Plan Note (Signed)
Well-controlled with last check 127/83 (goal <130/90), off meds. Pulse occasionally upper 50's, and has been off beta blocker, so would consider CCB if BP control becomes an issue, or resume metoprolol if heart rate allows, given intermittent afib. Monitor with routine vital checks, regardless.

## 2013-07-02 NOTE — Assessment & Plan Note (Signed)
Waxing / waning with agitation and wandering behavior improved on scheduled Seroquel with PRN extra dose. Remains on Aricept, Namenda, Remeron as well. No changes to medications today. Will monitor at regular visits.

## 2013-07-02 NOTE — Progress Notes (Signed)
  Subjective:    Patient ID: Shannon Stokes, female    DOB: Dec 30, 1916, 80 y.o.   MRN: [id number]  HPI: Pt seen at bedside at [facility] for monthly visit. Pt sitting in wheelchair in hallway. Pt does not recognize me but is very pleasant and has no specific complaints.  Pons stroke and waxing/waning dementia - no new complaints of weakness. Over the last few months had increased agitation at times and wandering behavior at night, which has improved. Now on Aricept, Nameda, and Remeron with Seroquel scheduled at bedtime with an extra dose for agitation PRN.   Afib - remains on Eliquis and Lipitor. Has been off beta blocker for >3 months. Denies palpitations or chest pain. States "my heart's fine as far as I know."  HTN - has been markedly elevated in the past (systolic to 170+) but has been better recently without medication (off beta blocker as above). Denies headache, chest pain, palpitations, LE swelling.  Pt is a never smoker.  In addition to the above documentation, pt's PMH, surgical history, FH, and SH all reviewed and updated where appropriate in the EMR.  I have also reviewed and updated the pt's allergies and current medications as appropriate.   Review of Systems: As above. Limited by dementia. Specifically denies pain, difficulty breathing, chest pain, abdominal pain when questioned.     Objective:   Physical Exam BP 127/83  Pulse 59  Temp(Src) 99.5 F (37.5 C)  Resp 20 (vitals taken 3/7) Gen: well-appearing elderly female, sitting up in wheelchair in NAD HEENT: PERRLA, MMM  Pulm: CTAB, no wheezes Cardio: rate/rhythm regular without frank murmur  Heart sounds remain distant but no obvious abnormality on auscultation Abd: soft, nontender, BS+  Ext: warm, well-perfused, no LE edema  Neuro: awake / alert, pleasantly confused  No frank neuro deficit; grip strength intact bilaterally  4+/5 strength in bilateral upper and lower extremities with elbow and knee  flexion / extension     Assessment & Plan:

## 2013-07-04 ENCOUNTER — Non-Acute Institutional Stay: Payer: Medicare Other | Admitting: Family Medicine

## 2013-07-04 DIAGNOSIS — I6990 Unspecified sequelae of unspecified cerebrovascular disease: Secondary | ICD-10-CM

## 2013-07-04 DIAGNOSIS — I480 Paroxysmal atrial fibrillation: Secondary | ICD-10-CM

## 2013-07-04 DIAGNOSIS — I6930 Unspecified sequelae of cerebral infarction: Secondary | ICD-10-CM

## 2013-07-04 DIAGNOSIS — I4891 Unspecified atrial fibrillation: Secondary | ICD-10-CM

## 2013-07-04 DIAGNOSIS — F0390 Unspecified dementia without behavioral disturbance: Secondary | ICD-10-CM

## 2013-07-04 DIAGNOSIS — I10 Essential (primary) hypertension: Secondary | ICD-10-CM

## 2013-07-06 NOTE — Progress Notes (Signed)
   Subjective:    Patient ID: Shannon Stokes, female    DOB: 1916-05-14, 80 y.o.   MRN: [id number]  HPI 80 year old female with past medical history of dementia, CVA, hypertension, atrial fibrillation, history of falls seen and evaluated at [facility]. Patient is seen outside of her room sitting in wheelchair. She denies acute pain at this time, she is unable to provide any additional history of present illness due to her severe dementia  No acute issues identified from the nursing staff. Patient has had waxing and waning mental status, agitation has been improved since initiation of Remeron and Seroquel, tolerating diet, regular BM's per nursing staff.   No family members are present to obtain additional history of present illness.  Reviewed recent nursing home visit completed by Dr. Maryjean Kah[address] Street.   Review of Systems Unable to obtain due to severe dementia.    Objective:   Physical Exam Vitals: Reviewed General: Caucasian female, sitting in wheelchair, patient is not interested in being examined today Cardiac: Regular in rhythm, S1 and S2 present, no murmurs Respiratory: Clear to patient bilaterally, normal Abdomen: Soft, nontender, bowel sounds Extremities: Trace bilateral pedal edema Neuro: Patient oriented to self, does not answer additional orientation questions, does participate in additional neuro examination due to dementia     Assessment & Plan:  Please see problem specific assessment and plan.

## 2013-07-06 NOTE — Assessment & Plan Note (Signed)
Currently NSR.  -continue Eliquis, consider discontinuation if patient begins to fall more frequently -off BB due to low pulse, no plan to restart at this time as rate controlled

## 2013-07-06 NOTE — Assessment & Plan Note (Signed)
Well controlled at this time without pharmacotherapy. -continue to monitor blood pressures

## 2013-07-06 NOTE — Assessment & Plan Note (Signed)
No new neurologic symptoms reported. Patient not compliant with neurologic exam due to dementia. -continue current secondary prevention with Eliquis and Lipitor

## 2013-07-06 NOTE — Assessment & Plan Note (Signed)
Stable at this time. Agitation improved on Seroquel.  -continue current regimen of Aricept, Namenda, Remeron, Seroquel

## 2013-08-16 NOTE — Telephone Encounter (Signed)
Closing encounter

## 2013-08-31 ENCOUNTER — Encounter: Payer: Self-pay | Admitting: Pharmacist

## 2013-09-05 ENCOUNTER — Non-Acute Institutional Stay: Payer: Medicare Other | Admitting: Family Medicine

## 2013-09-05 DIAGNOSIS — F0390 Unspecified dementia without behavioral disturbance: Secondary | ICD-10-CM

## 2013-09-05 DIAGNOSIS — I480 Paroxysmal atrial fibrillation: Secondary | ICD-10-CM

## 2013-09-05 DIAGNOSIS — I10 Essential (primary) hypertension: Secondary | ICD-10-CM

## 2013-09-05 DIAGNOSIS — I4891 Unspecified atrial fibrillation: Secondary | ICD-10-CM

## 2013-09-05 MED ORDER — QUETIAPINE FUMARATE 25 MG PO TABS: 25.0000 mg | ORAL_TABLET | Freq: Every evening | ORAL | Status: DC | PRN

## 2013-09-05 MED ORDER — MEMANTINE HCL 10 MG PO TABS: 10.0000 mg | ORAL_TABLET | Freq: Every day | ORAL | Status: DC

## 2013-09-05 NOTE — Assessment & Plan Note (Signed)
Stable. Agitation improved. -Continue Aricept 10 mg daily, Remeron 15 mg daily, and Namenda 10 mg daily -Change Seroquel to PRN (monitor for agitation).

## 2013-09-05 NOTE — Assessment & Plan Note (Signed)
Currently NSR.  -continue Eliquis

## 2013-09-05 NOTE — Assessment & Plan Note (Signed)
Slightly elevated today. Off an anti-hypertensive agents. -monitor and consider pharmacotherapy if remains elevated

## 2013-09-05 NOTE — Progress Notes (Signed)
   Subjective:    Patient ID: Marcheta Grammes, female    DOB: 05-08-1916, 80 y.o.   MRN: [id number]  HPI 80 y/o female with PMH Dementia, HTN, CVA, Atrial Fibrillation seen and evaluated at [facility].   No acute issues per nursing staff. Agitation/combativeness has been greatly improved over the past month.  Dementia - patient currently on Aricept 10 mg daily and Namenda 10 mg daily, tolerating well, patient oriented to self, does not know president, place, or date  Atrial Fibrillation - currently on Abixiban, off BB as previously had low BP  Agitation - significantly improved, currently on Seroquel 25 mg nightly  HTN - off all anti-hypertensive agents, no chest pain, no vision changes   Review of Systems  Constitutional: Negative for fever, chills and fatigue.  Respiratory: Negative for cough and shortness of breath.   Cardiovascular: Negative for chest pain and leg swelling.  Gastrointestinal: Negative for nausea, vomiting and diarrhea.       Objective:   Physical Exam Vitals: reviewed Gen: pleasant female, sitting in wheelchair, drinking magic cup HEENT: pupils equal in size, EOMI, no scleral icterus, MMM, no pharyngeal erythema, neck supple Cardiac: RRR, S1 and S2 present, no murmurs, no heaves/thrills Resp: CTAB, normal effort Abd: soft, no tenderness, normal bowel sounds Ext: trace LE edema Neuro: oriented only to self, moving all extremities     Assessment & Plan:  Please see problem specific assessment and plan.

## 2013-09-06 ENCOUNTER — Non-Acute Institutional Stay (INDEPENDENT_AMBULATORY_CARE_PROVIDER_SITE_OTHER): Payer: Medicare Other | Admitting: Family Medicine

## 2013-09-06 ENCOUNTER — Encounter: Payer: Self-pay | Admitting: Family Medicine

## 2013-09-06 DIAGNOSIS — I10 Essential (primary) hypertension: Secondary | ICD-10-CM

## 2013-09-06 DIAGNOSIS — I4891 Unspecified atrial fibrillation: Secondary | ICD-10-CM

## 2013-09-06 DIAGNOSIS — I6990 Unspecified sequelae of unspecified cerebrovascular disease: Secondary | ICD-10-CM

## 2013-09-06 DIAGNOSIS — F0390 Unspecified dementia without behavioral disturbance: Secondary | ICD-10-CM

## 2013-09-06 DIAGNOSIS — I480 Paroxysmal atrial fibrillation: Secondary | ICD-10-CM

## 2013-09-06 DIAGNOSIS — I6930 Unspecified sequelae of cerebral infarction: Secondary | ICD-10-CM

## 2013-09-06 NOTE — Assessment & Plan Note (Signed)
A: NSR by clinical exam the past few months, off beta-blocker and on Eliquis for anticoagulation without evidence for bleeding.  P: Continue Eliquis. Consider restart beta-blocker (previously on metoprolol) if needed; see also HTN problem list note.

## 2013-09-06 NOTE — Assessment & Plan Note (Signed)
A: No new obvious neurologic symptoms / deficits; difficult exam secondary to dementia and resultant difficulty with cooperation.  P: Continue Eliquis, Lipitor. Encourage physical activity as tolerated.

## 2013-09-06 NOTE — Assessment & Plan Note (Signed)
A: Overall with some waxing and waning but little change in several months. Agitation improved in recent weeks and subsequently recently had scheduled Seroquel stopped and changed to PRN only.  P: Continue Aricept, Remeron, Namenda. Continue Seroquel qHS PRN. Monitor for any worsening agitation and consider restarting scheduled Seroquel, if needed.

## 2013-09-06 NOTE — Progress Notes (Signed)
   Subjective:    Patient ID: Shannon Stokes, female    DOB: 07-01-1916, 80 y.o.   MRN: [id number]  HPI: Pt seen at bedside at [facility] for monthly visit. Pt sitting in wheelchair in hallway. Similar to last visit, pt does not recognize me. She does state, "I think I've met you before, but I don't remember you."  Pons stroke and waxing/waning dementia - no new complaints of weakness. Per nursing report, overall agitation / wandering behavior has improved.  - Now on Aricept, Nameda, and Remeron, with Seroquel at night - recently nursing home team has stopped scheduled Seroquel at bedtime in favor of PRN only  Afib, recently in NSR by physical exam - no current complaint, with denial of chest pain, SOB, or LE swelling. Remains on Eliquis and Lipitor. Has been off beta blocker for several months  HTN - Intermittently has been markedly elevated (systolic to 778+), less markedly elevated recently (242'P systolic), but has been off beta blocker as above. Denies headache, chest pain, palpitations, LE swelling.  Pt is a never smoker.  In addition to the above documentation, pt's PMH, surgical history, FH, and SH all reviewed and updated where appropriate in the EMR.  I have also reviewed and updated the pt's allergies and current medications as appropriate.   Review of Systems: As above. Limited by dementia. Specifically denies pain and other complaints as above with prompted / asked for individual complaints.     Objective:   Physical Exam BP 157/68  Pulse 73  Temp(Src) 97.6 F (36.4 C)  Resp 20  Gen: well-appearing elderly female, sitting up in wheelchair in NAD HEENT: PERRLA, MMM  Pulm: CTAB, no wheezes Cardio: rate/rhythm regular without frank murmur Abd: soft, nontender, BS+  Ext: warm, well-perfused, no LE edema  Neuro: awake / alert, pleasant; oriented to self but not place, time, or circumstance  No frank neuro deficit; grip strength intact bilaterally, 4+/5  4+/5  strength in bilateral upper and lower extremities     Assessment & Plan:  See problem list notes.

## 2013-09-06 NOTE — Assessment & Plan Note (Signed)
A: Last check mildly elevated, with goal 130/90 per neurologist consultation in 11/2012 immediately after acute stroke. No new neuro deficits and off beta-blocker for several months.  P: Monitor BP regularly, for now. Could consider something like low-dose HCTZ or restarting beta-blocker, if needed.

## 2013-09-20 ENCOUNTER — Encounter: Payer: Self-pay | Admitting: Pharmacist

## 2013-10-23 ENCOUNTER — Non-Acute Institutional Stay (INDEPENDENT_AMBULATORY_CARE_PROVIDER_SITE_OTHER): Payer: Medicare Other | Admitting: Family Medicine

## 2013-10-23 ENCOUNTER — Encounter: Payer: Self-pay | Admitting: Family Medicine

## 2013-10-23 DIAGNOSIS — I4891 Unspecified atrial fibrillation: Secondary | ICD-10-CM

## 2013-10-23 DIAGNOSIS — I6990 Unspecified sequelae of unspecified cerebrovascular disease: Secondary | ICD-10-CM

## 2013-10-23 DIAGNOSIS — I10 Essential (primary) hypertension: Secondary | ICD-10-CM

## 2013-10-23 DIAGNOSIS — I480 Paroxysmal atrial fibrillation: Secondary | ICD-10-CM

## 2013-10-23 DIAGNOSIS — F03918 Unspecified dementia, unspecified severity, with other behavioral disturbance: Secondary | ICD-10-CM

## 2013-10-23 DIAGNOSIS — I6930 Unspecified sequelae of cerebral infarction: Secondary | ICD-10-CM

## 2013-10-23 DIAGNOSIS — F0391 Unspecified dementia with behavioral disturbance: Secondary | ICD-10-CM

## 2013-10-23 NOTE — Progress Notes (Signed)
   Subjective:    Patient ID: Shannon Stokes, female    DOB: 07-19-1916, 80 y.o.   MRN: [id number]  HPI: Pt seen at [facility] for monthly visit. Pt sitting in wheelchair in hallway. As with last several visits, pt does not recognize me and asks "Shannon Stokes who?" when questioned if she has seen Mr. Shannon Stokes (long-time neighbor and power of attorney). Denies frank complaints and states when asked if she is treated well, "It's like anything else, everybody's really busy" (note this is the same response she gives when asked if she needs anything).   Pons stroke and waxing/waning dementia - no new complaints of weakness.  - Less wandering behavior / acting out in recent weeks. - Now on Aricept, Nameda, and Remeron, with Seroquel at night only PRN  Afib, has been in NSR by physical exam - no current complaint, with denial of chest pain, SOB, or LE swelling.  - remains on Eliquis and Lipitor and continues off beta blocker for several months  HTN - has been markedly elevated (systolic to 170+), but has been off beta blocker as above and more recently has been well-controlled - continues to denies headache, chest pain, palpitations, LE swelling - currently states she feels like her medicines "work pretty well"  Pt is a never smoker. In addition to the above documentation, pt's PMH, surgical history, FH, and SH all reviewed and updated where appropriate in the EMR. I have also reviewed and updated the pt's allergies and current medications as appropriate.  Review of Systems: As above. Otherwise states she feels well.     Objective:   Physical Exam BP 146/62  Pulse 72  Temp(Src) 98.5 F (36.9 C)  Resp 21  Gen: well-appearing elderly female, sitting in wheelchair in NAD; pleasantly demented HEENT: PERRLA, MMM  Pulm: CTAB, no wheezes  Cardio: no frank murmur; rate and rhythm both normal to auscultation Abd: soft, nontender, BS+  Ext: warm, well-perfused, no LE edema  Neuro: awake /  alert, pleasant; oriented to self but not place, time, or circumstance   No frank neuro deficit; grip strength intact bilaterally, 4+/5  Some difficulty getting pt to use left hand but with multiple trials uses hand well / normally  4+/5 strength in bilateral upper and lower extremities    Assessment & Plan:  See problem list notes.

## 2013-10-23 NOTE — Assessment & Plan Note (Signed)
A: Clinically in NSR by exam for the past few months. Off beta-blocker and stable on Eliquis.  P: Continue Eliquis. Consider restart metoprolol if needed.

## 2013-10-23 NOTE — Assessment & Plan Note (Signed)
A: Goal 130/90 per neurologist consultation immediately after stroke last year; pt has been off beta-blocker and any other HTN meds for several months, with recent readings either close to goal or mildly elevated; most recent elevation noted today was 146 systolic on 7/4.  P: Monitor BP regularly but could consider low-dose HCTZ or restarting beta-blocker (previously on metoprolol) if BP readings continue to be high.

## 2013-10-23 NOTE — Assessment & Plan Note (Signed)
A: Some waxing / waning of symptoms but little change in several months; acting-out behavior and nighttime wandering has been better as well, now currently on Aricept, Remeron, and Namenda, with Seroquel only as needed.  P: Continue current meds / doses with Seroquel on PRN qHS. Monitor for any frank worsening; consider restarting scheduled Seroquel if needed, but hopefully this won't be necessary.

## 2013-10-23 NOTE — Assessment & Plan Note (Signed)
A: No new or obvious neurologic symptoms or deficits, though full neuro exam unable to be performed due to pt's dementia and inability to concentrate / cooperate fully.  P: Continue Eliquis, Lipitor. Could consider stopping statin given age. Encourage physical activity as tolerated. See also dementia problem list note.

## 2013-10-29 ENCOUNTER — Non-Acute Institutional Stay: Payer: Medicare Other | Admitting: Family Medicine

## 2013-10-29 ENCOUNTER — Encounter: Payer: Self-pay | Admitting: Family Medicine

## 2013-10-29 DIAGNOSIS — F0391 Unspecified dementia with behavioral disturbance: Secondary | ICD-10-CM

## 2013-10-29 DIAGNOSIS — I6930 Unspecified sequelae of cerebral infarction: Secondary | ICD-10-CM

## 2013-10-29 DIAGNOSIS — I10 Essential (primary) hypertension: Secondary | ICD-10-CM

## 2013-10-29 DIAGNOSIS — I480 Paroxysmal atrial fibrillation: Secondary | ICD-10-CM

## 2013-10-29 DIAGNOSIS — F03918 Unspecified dementia, unspecified severity, with other behavioral disturbance: Secondary | ICD-10-CM

## 2013-10-29 DIAGNOSIS — I6990 Unspecified sequelae of unspecified cerebrovascular disease: Secondary | ICD-10-CM

## 2013-10-29 DIAGNOSIS — I4891 Unspecified atrial fibrillation: Secondary | ICD-10-CM

## 2013-10-29 NOTE — Assessment & Plan Note (Signed)
FAST Stage 6 Vascular dementia Vs Mixed Dementia (Probable Alzheimers and Vascular components) Behavioral and Psychological Symptoms of Dementia managed with non-pharmacologic measures currently.  Tolerating memantine and donepezil currently, but as patient progresses into final FAST stage 7 with decline in language and upper body control, these medications should have a trial of dose reduction / elimination.   Recommend stopping the Seroquel as patient has not needed it.

## 2013-10-29 NOTE — Assessment & Plan Note (Signed)
Patient at goal for her age < 150/90. Continue non-pharmacologic management

## 2013-10-29 NOTE — Assessment & Plan Note (Signed)
Currently in a regular rate & rhythm on exam. Continue Apixiban.  Agree with not restarting negative chronotropic medication at this time.

## 2013-10-29 NOTE — Assessment & Plan Note (Signed)
Stable. No clinical evidence of new CNS events. Continue Apixiban and atorvastatin as patient appears to be tolerating them.

## 2013-10-29 NOTE — Progress Notes (Signed)
Patient ID: Shannon Stokes, female   DOB: October 04, 1916, 80 y.o.   MRN: [id number]  I have seen and examined this patient. I have discussed with Dr Casper HarrisonStreet.  I agree with their findings and plans as documented in their regulatory visit note from 10/23/12 note.  Additions and changes noted in Red Text.    Subjective:    Patient ID: Shannon GrammesVallie Stokes, female    DOB: October 04, 1916, 80 y.o.   MRN: [id number]  HPI: Pt seen at [facility] for monthly visit. Pt sitting in wheelchair in hallway. Shakes head in "no" in asking about pain or discomfort.  History severely limited by decreased use of language.  Nursing reports that patient sleeping most of daytime, but often awake at night.   Pons stroke and waxing/waning dementia - - Left sided hemiparesis following CVA in 11/2013.  - Longstanding Dementia diagnosis extending back to a hospitalization in 2011. 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  - No wandering behaviors, no recent agitation that required antipsychotic medication.  Last dose of Seroquel in April 2015.  - Dependent in all ADLs except feeding. Pt has been using more than 6 words per day to indicate needs per nursing - FAST Stage 6- needs help with ADLs;   - Now on Aricept, Nameda, and Remeron, with Seroquel at night only PRN  Afib, paroxysmal - pAF diagnosed during admission for CVA in August 2014.  - as been in NSR by physical exam  - no current complaint, with denial of chest pain, SOB, or LE swelling.   - remains on Eliquis and Lipitor. No bleeding reported. No abdominal pain reported.    HTN -  - Recent NH BP measurements SBP < 150.   - Currently on no antihypertensive medications.    Pt is a never smoker. In addition to the above documentation, pt's PMH, surgical history, FH, and SH all reviewed and updated where appropriate in the EMR. I have also reviewed and updated the pt's allergies and current  medications as appropriate.  Review of Systems: As above. Otherwise states she feels well.     Objective:   Physical Exam BP 132/78  Pulse 74  Temp(Src) 97.8 F (36.6 C)  Wt 135 lb 3.2 oz (61.326 kg)  Gen: well-appearing elderly female, sitting in wheelchair in NAD; pleasantly demented HEENT: PERRLA, MMM  Pulm: CTAB, no wheezes  Cardio: no frank murmur; rate and rhythm both normal to auscultation Abd: soft, nontender, BS+  Ext: warm, well-perfused, no LE edema  Neuro: awaken briefly to questions then falls back asleep.       Assessment & Plan:  See problem list notes.

## 2013-11-05 ENCOUNTER — Encounter: Payer: Self-pay | Admitting: Family Medicine

## 2013-11-05 LAB — VITAMIN D 25 HYDROXY (VIT D DEFICIENCY, FRACTURES): VIT D 25 HYDROXY: 33 ng/mL (ref 30–89)

## 2013-11-27 ENCOUNTER — Encounter: Payer: Self-pay | Admitting: Pharmacist

## 2013-12-14 LAB — CBC AND DIFFERENTIAL
HCT: 33 % — AB (ref 36–46)
HEMOGLOBIN: 10.9 g/dL — AB (ref 12.0–16.0)
Platelets: 200 10*3/uL (ref 150–399)
WBC: 7.5 10*3/mL

## 2013-12-20 ENCOUNTER — Encounter: Payer: Self-pay | Admitting: Family Medicine

## 2013-12-31 LAB — CBC AND DIFFERENTIAL
HCT: 34 % — AB (ref 36–46)
HEMOGLOBIN: 11.1 g/dL — AB (ref 12.0–16.0)
PLATELETS: 190 10*3/uL (ref 150–399)
WBC: 7.7 10*3/mL

## 2014-01-01 ENCOUNTER — Encounter: Payer: Self-pay | Admitting: Family Medicine

## 2014-01-01 ENCOUNTER — Non-Acute Institutional Stay (INDEPENDENT_AMBULATORY_CARE_PROVIDER_SITE_OTHER): Payer: Medicare Other | Admitting: Family Medicine

## 2014-01-01 DIAGNOSIS — I4891 Unspecified atrial fibrillation: Secondary | ICD-10-CM

## 2014-01-01 DIAGNOSIS — F0391 Unspecified dementia with behavioral disturbance: Secondary | ICD-10-CM

## 2014-01-01 DIAGNOSIS — I480 Paroxysmal atrial fibrillation: Secondary | ICD-10-CM

## 2014-01-01 DIAGNOSIS — F03918 Unspecified dementia, unspecified severity, with other behavioral disturbance: Secondary | ICD-10-CM

## 2014-01-01 DIAGNOSIS — I6930 Unspecified sequelae of cerebral infarction: Secondary | ICD-10-CM

## 2014-01-01 DIAGNOSIS — I10 Essential (primary) hypertension: Secondary | ICD-10-CM

## 2014-01-01 DIAGNOSIS — I6990 Unspecified sequelae of unspecified cerebrovascular disease: Secondary | ICD-10-CM

## 2014-01-01 NOTE — Progress Notes (Signed)
   Subjective:    Patient ID: Shannon Stokes, female    DOB: 1916-07-13, 80 y.o.   MRN: [id number]  HPI: Pt seen at [facility] for monthly visit. History limited by Pt sitting in wheelchair in hallway. Pt continues to not recognize me. Denies frank complaints. States she is "eating everything she can, like everybody else in this place," when questioned if her appetite is okay.  Pons stroke and waxing/waning dementia - no new complaints of weakness.  - Less wandering behavior / acting out in recent weeks; has not required Seroquel in several weeks / months. - Continues to be on Aricept, Nameda, and Remeron  Afib, has been in NSR in recent months and has no current chest pain, SOB, or LE swelling - remains on Eliquis and Lipitor and continues off beta blocker for several months  HTN - has been off beta blocker for several months with BP's in goal range - continues to denies headache, chest pain, palpitations, LE swelling  Pt is a never smoker. In addition to the above documentation, pt's PMH, surgical history, FH, and SH all reviewed and updated where appropriate in the EMR. I have also reviewed and updated the pt's allergies and current medications as appropriate.  Review of Systems: As above. Otherwise states she feels well.     Objective:   Physical Exam BP 115/64  Pulse 69  Temp(Src) 97.3 F (36.3 C)  Resp 20  Note vitals above are from 12/15/13 Gen: well-appearing elderly female, sitting in wheelchair in NAD; pleasantly demented HEENT: PERRLA, MMM  Pulm: CTAB, no wheezes  Cardio: no frank murmur; rate and rhythm both normal to auscultation Abd: soft, nontender, BS+  Ext: warm, well-perfused, no LE edema  Neuro: awake / alert, pleasant; oriented to self but not place, time, or circumstance   No frank neuro deficit; grip strength intact bilaterally, 4+/5     Assessment & Plan:  See problem list notes.

## 2014-01-01 NOTE — Assessment & Plan Note (Signed)
A: Some waxing / waning of symptoms but little change in several months; acting-out behavior and nighttime wandering has been better as well, now currently on Aricept, Remeron, and Namenda. Has not required Seroquel in several weeks / months. Per nursing report, pt does sleep during most of the day and is awake at night, but behavior has not been a big issue.  P: Continue current meds / doses. Consider restarting Seroquel on PRN qHS if needed, but doubt this will be necessary. Consider trial off of Aricept / Remeron / Namenda if or as dementia progresses. Monitor clinically for any frank worsening, otherwise.

## 2014-01-01 NOTE — Assessment & Plan Note (Signed)
A: Currently in NSR with regular rate by physical exam, stable on Eliquis without evidence for bleeding.  P: Continue with Eliquis and consider restart metoprolol only if clinically re-enters afib or if rate becomes over 110.

## 2014-01-01 NOTE — Assessment & Plan Note (Signed)
A: No new or obvious neurologic symptoms or deficits, though full neuro exam not performed today (difficult exam due to pt's dementia and inability to concentrate / cooperate fully).  P: Continue Eliquis given history of afib, continue Lipitor. Could consider stopping statin given age, if she continues to be stable. Encourage physical activity as tolerated. See also dementia problem list note.

## 2014-01-01 NOTE — Assessment & Plan Note (Signed)
A: BP at goal without pharmacologic treatment. Previously on metoprolol for BP and rate control given afib, but has been in NSR by physical exam for several weeks/months.  P: Continue to monitor off meds, and consider restart metoprolol if needed.

## 2014-01-02 ENCOUNTER — Encounter: Payer: Self-pay | Admitting: Pharmacist

## 2014-01-04 ENCOUNTER — Encounter: Payer: Self-pay | Admitting: Family Medicine

## 2014-01-04 NOTE — Progress Notes (Signed)
Patient ID: Shannon Stokes, female   DOB: 11/01/1916, 80 y.o.   MRN: [id number] I discussed with  Dr Casper Harrison.  I agree with their plans documented in their regulatory visit note.

## 2014-01-25 ENCOUNTER — Encounter: Payer: Self-pay | Admitting: Family Medicine

## 2014-01-25 LAB — LIPID PANEL
ALK PHOS: 49 U/L (ref 39–117)
AST: 14 U/L (ref 0–37)
Albumin: 2.9 g/dL — AB (ref 3.5–5.2)
BILIRUBIN, DIRECT (MICRO): 0.1 mg/dL (ref 0.0–0.3)
BILIRUBIN, TOTAL: 0.5 mg/dL (ref 0.2–1.2)
CHOL/HDL RATIO: 2 ratio
CHOLESTEROL: 96 mg/dL (ref 0–200)
HDL: 48 mg/dL (ref 35–70)
Indirect Bilirubin: 0.4 mg/dL (ref 0.2–1.2)
LDL Calculated: 37 mg/dL (ref 0–99)
Total Protein: 5.4 g/dL — AB (ref 6.0–8.3)
Triglycerides: 53 mg/dL (ref 40–150)
VLDL Cholesterol Cal: 11 mg/dL (ref 0–40)

## 2014-02-08 ENCOUNTER — Non-Acute Institutional Stay: Payer: Medicare Other | Admitting: Family Medicine

## 2014-02-08 DIAGNOSIS — I10 Essential (primary) hypertension: Secondary | ICD-10-CM

## 2014-02-08 DIAGNOSIS — G4701 Insomnia due to medical condition: Secondary | ICD-10-CM

## 2014-02-08 DIAGNOSIS — F0390 Unspecified dementia without behavioral disturbance: Secondary | ICD-10-CM

## 2014-02-08 DIAGNOSIS — I480 Paroxysmal atrial fibrillation: Secondary | ICD-10-CM

## 2014-02-08 NOTE — Assessment & Plan Note (Signed)
Chronic problem Stable  No evidence of new end organ damage.  Monitor.

## 2014-02-08 NOTE — Progress Notes (Signed)
Patient ID: Shannon GrammesVallie Stokes, female   DOB: 03-31-1917, 80 y.o.   MRN: [id number] [facility]  Visit  Provider: Dr Burnis Medinhris Street Location of Care: [facility] Visit Information: a scheduled routine follow-up visit Patient accompanied by patient Chief Complaint: No complaints.  Nursing did not have any concerns about patient.  HISTORY OF PRESENT ILLNESS:  Problem List Items Addressed This Visit     Unprioritized   Intermittent atrial fibrillation - Longstanding issue - On Apixiban.  No bleeding noted. - No new focal weaknesses or change in usual behavior.  - Rate controlled per record.   HTN (hypertension) - On no BP medications - No reports of chest pain, new limb or face weakness. - Remains engaged with social activities at facility.    Dementia - Primary - Longstanding issue - On Namenda and Aricept - Has Seroquel available for agitation but has not had to use it for the entire month of October.  - Dependent or needs assistance with all ADLs except eating.  -        Insomnia -   Longstanding issue for patient - On remeron 15 mg qhs - Remains with frequent episodes of cirdadian inversion of sleep, but facility is able to accommodate the pattern.     Outpatient Encounter Prescriptions as of 02/08/2014  Medication Sig  . acetaminophen (TYLENOL) 325 MG tablet Take 650 mg by mouth 4 (four) times daily.  . Amino Acids-Protein Hydrolys (FEEDING SUPPLEMENT, PRO-STAT SUGAR FREE 64,) LIQD Take 30 mLs by mouth daily before lunch.  Marland Kitchen. apixaban (ELIQUIS) 2.5 MG TABS tablet Take 2.5 mg by mouth 2 (two) times daily.  Marland Kitchen. atorvastatin (LIPITOR) 20 MG tablet Take 20 mg by mouth daily.  . calcium citrate (CALCITRATE - DOSED IN MG ELEMENTAL CALCIUM) 950 MG tablet Take 2 tablets by mouth daily.  Marland Kitchen. donepezil (ARICEPT) 5 MG tablet Take 2 tablets (10 mg total) by mouth at bedtime.  . memantine (NAMENDA) 10 MG tablet Take 10 mg by mouth 2 (two) times daily.  . mirtazapine (REMERON)  15 MG tablet Take 15 mg by mouth at bedtime.  . Multiple Vitamins-Minerals (MULTIVITAMIN PO) Take 1 tablet by mouth daily.  . Vitamin D, Ergocalciferol, (DRISDOL) 50000 UNITS CAPS capsule Take 50,000 Units by mouth every 30 (thirty) days.  Marland Kitchen. QUEtiapine (SEROQUEL) 50 MG tablet Take 50 mg by mouth daily as needed (for agitation and behavioral problems).   History Patient Active Problem List   Diagnosis Date Noted  . Insomnia due to medical condition 03/01/2013  . Fall at nursing home 12/05/2012  . Intermittent atrial fibrillation 11/26/2012  . History of stroke with residual effects 11/24/2012  . Hemiplegia, unspecified, affecting nondominant side 11/24/2012  . Personal history of fall 08/22/2012  . Long toenail 08/21/2012  . Dementia 03/11/2010  . GAIT IMBALANCE 03/11/2010  . HTN (hypertension) 03/11/2010   Past Medical History  Diagnosis Date  . Hypertension   . Stroke    Past Surgical History  Procedure Laterality Date  . Appendectomy    . Ankle surgery      "as a child"   No family history on file.  reports that she has never smoked. She does not have any smokeless tobacco history on file. She reports that she does not drink alcohol.  Basic Activities of Daily Living and Instrumental Activities of Daily Living Needs assistance with all ADLs except eating.    Falls in the past six months:   no  Diet:  general Supplemental shakes:  yes  Review of Systems  See HPI General: Denies fevers, chills, weight loss, fatigue, weight gain.  Eyes: Denies pain, blurred vision, discharge.  Ears/Nose/Throat: Denies ear pain, throat pain, rhinorrhea, nasal congestion.  Cardiovascular: Denies complaints, chest pains, palpitations, dyspnea on exertion, orthopnea, peripheral edema.  Respiratory: Denies cough, sputum, dyspnea.  Gastrointestinal: Denies abd pain, bloating, constipation, diarrhea.  Genitourinary: Denies dysuria, urinary frequency, discharge, pelvic pain.  Musculoskeletal:  Denies joint pain, swelling, weakness.  Skin: Denies skin rash or ulcers. Neurologic: Denies transient paralysis, weakness, paresthesias, headache.  Psychiatric: Denies depression, anxiety, psychosis. Endocrine: Denies weight change.   PHYSICAL EXAM:. Wt Readings from Last 3 Encounters:  01/13/14 138 lb 6.4 oz (62.778 kg)  10/17/13 135 lb 3.2 oz (61.326 kg)  01/22/13 148 lb (67.132 kg)   Temp Readings from Last 3 Encounters:  02/02/14 97.2 F (36.2 C)   12/15/13 97.3 F (36.3 C)   10/27/13 97.8 F (36.6 C)    BP Readings from Last 3 Encounters:  02/02/14 128/72  12/15/13 115/64  10/13/13 132/78   Pulse Readings from Last 3 Encounters:  02/02/14 60  12/15/13 69  10/27/13 74    General: No acute distress, well nourished, pleasant, clean, groomed HEENT:  No scleral icterus, no nasal secretions, TMs clear bilat, Oromucosa moist and no erythema or lesion Neck:  Supple, No JVD, no lymphadenopathy CV:  RRR, no murmur, no ankle swelling RESP: No resp distress or accessory muscle use.  Clear to ausc bilat. No wheezing, no rales, no rhonchi.  ABD:  Soft, Non-tender, non-distended, +bowel sounds, no masses MSK:  No back pain, no joint pain.  No joint swelling or redness EXT:      RUE:  5/5 strength, normal ROM at shoulder, elbow, wrist  LUE:  5/5 strength, normal ROM at shoulder, elbow, wrist  RLE:  5/5 strength, normal ROM at hip, knee, ankle  LLE:  5/5 strength, normal ROM at hip, knee, ankle Gait:  Able to ambulate alone, good balance Skin:  No significant skin lesions or rash Neurologic:  Cranial nerves 2-12 grossly intact, normal tone in extremities, normal strength in extremities Psych:  Fully oriented, Judgment and insight normal, Memory normal for long and short term, Mood and affect appropriate   Assessment and Plan:   See Problem List for individual problem's assessment and plans.   Code Status:    Full Code Emergency contact:   Lucilla EdinJames Burkhead (other) [phone]  (m) [phone] (h) Follow Up:  Next 60 days unless acute issues arise.    Social History excerpt: "-Mr. Lucilla EdinJames Burkhead is pt's HCPOA [facility](Home [phone], Cell [phone]) -Pt has a form stating her "natural death desires," which in summary states that she would not want life-prolonging measures in the event of a terminal/vegetative state.  -Prior to SNF admission / stroke in late 2014, pt states she would however want normal therapies initiated as needed in the event of acute illness, up to and including CPR, intubation, and artificial respiration.

## 2014-02-08 NOTE — Assessment & Plan Note (Signed)
Chronic problem Stable circadian inversion of sleep pattern which facility has been able to accommodate. Patient on Remeron 15 mg qhs. Not sure this is helping much. Recommend titrating off over 2 to 4 weeks on next regulatory visit.

## 2014-02-08 NOTE — Assessment & Plan Note (Addendum)
Chronic problem Stable.  Palliative Performance Scale: 40 to 50% Continue Namenda and Aricept.  Recommend a trial off once into FAST Stage VII. Code Status is Full Code.  MOST form completed with the HCPOA  Would be an appropriate method of defining scope of care for this patient who is statistically coming to the end of her life in the next 1 to 3 years.    Social History excerpt: "-Mr. Lucilla EdinJames Burkhead is pt's HCPOA [facility](Home [phone], Cell [phone]) -Pt has a form stating her "natural death desires," which in summary states that she would not want life-prolonging measures in the event of a terminal/vegetative state.  -Prior to SNF admission / stroke in late 2014, pt states she would however want normal therapies initiated as needed in the event of acute illness, up to and including CPR, intubation, and artificial respiration.

## 2014-02-08 NOTE — Assessment & Plan Note (Signed)
Chronic  Stable.  In NSR currently. On Apixiban without adverse effects. Continue current therapy.

## 2014-02-27 ENCOUNTER — Non-Acute Institutional Stay (INDEPENDENT_AMBULATORY_CARE_PROVIDER_SITE_OTHER): Payer: Medicare Other | Admitting: Family Medicine

## 2014-02-27 ENCOUNTER — Encounter: Payer: Self-pay | Admitting: Family Medicine

## 2014-02-27 DIAGNOSIS — F0390 Unspecified dementia without behavioral disturbance: Secondary | ICD-10-CM

## 2014-02-27 DIAGNOSIS — I6930 Unspecified sequelae of cerebral infarction: Secondary | ICD-10-CM

## 2014-02-27 DIAGNOSIS — G4701 Insomnia due to medical condition: Secondary | ICD-10-CM

## 2014-02-27 DIAGNOSIS — I480 Paroxysmal atrial fibrillation: Secondary | ICD-10-CM

## 2014-02-27 NOTE — Assessment & Plan Note (Signed)
A: No new or obvious neurologic changes, though pt in wheelchair and not cooperative with exam / interview, today, secondary to dementia.  P: Continue Eliquis given hx of afib, for now, and continue Lipitor, but given age and dementia, would agree with stopping either / both; defer to geriatrics team, for now. Encourage physical activity as tolerated.

## 2014-02-27 NOTE — Assessment & Plan Note (Signed)
A: In NSR via clinical exam of distal pulses only; pt did not cooperate with auscultation for heart sounds today, but has not been tachycardic and by exam has been in NSR for several weeks to months, without new obvious neuro deficits.  P: Continue Eliquis, for now, with q6 month BMP to monitor kidney function for dose adjustments; would be agreeable to stopping this, as pt has been clinically without persistent afib, but defer to geriatrics team, for now, as she has also been off a beta blocker for several months.

## 2014-02-27 NOTE — Progress Notes (Signed)
   Subjective:    Patient ID: Shannon Stokes, female    DOB: 24-Nov-1916, 80 y.o.   MRN: [id number]  HPI: [facility] at [facility] for [facility] visit. History limited by pt sitting in wheelchair in hallway. Pt not combative but is slightly agitated and states she "doesn't want to talk." She does not answer all questions, stating to some, "It doesn't matter." Denies pain other than her right knee but does not elaborate other than that it hurts. Pt does not recognize me, today. She states she is "on the lookout" because "people will take all you have, all you had or will ever have, because greed is an evil thing." States similar to last time that "I'm just like anybody else" when asked about appetite. Note: Pt previously listed a full code but has DNR order on chart from Dec 2014, signed by Dr. Armen PickupFunches.  Pons stroke and waxing/waning dementia - denies new complaints but does not answer all questions, as above  - inversion of sleep pattern, but accommodated by SNF - continues to be on Aricept, Nameda, and Remeron; previous consideration for trial off meds  Afib - has been in NSR in recent months - states "no" when asked about chest pain, but does not answer specific questions, as above - remains on Eliquis and Lipitor and continues off beta blocker for several months  HTN - has been off beta blocker for several months with BP's in goal range  Pt is a never smoker. In addition to the above documentation, pt's PMH, surgical history, FH, and SH all reviewed and updated where appropriate in the EMR. I have also reviewed and updated the pt's allergies and current medications as appropriate.  Review of Systems: As above. Otherwise states she feels well.     Objective:   Physical Exam BP 134/68 mmHg  Pulse 74  Temp(Src) 98.4 F (36.9 C)  Resp 20 Note vitals above are from 02/25/14 Gen: well-appearing elderly female, sitting in wheelchair in NAD; non-combative but not cooperative with  exam Pulm: normal work of breathing, speaks in full sentences, but does not allow auscultation Cardio: pulses in feet without frank irregularity (palpated while pt talking) but pt does not allow auscultation for heart sounds Abd: does not allow full exam Ext: warm, well-perfused, no LE edema  Neuro: awake / alert; does not answer orientation questions, stating only "I'm here"  No frank neuro deficit; does not cooperate with exam for strength     Assessment & Plan:  See problem list notes.

## 2014-02-27 NOTE — Assessment & Plan Note (Signed)
A: Inverted sleep pattern secondary to dementia, on Remeron 15 mg qHS still, but otherwise relatively unchanged and not currently problematic.  P: Agree with taper off of Remeron and will defer med orders for drug taper(s) to geriatrics team; discussed with Dr. Raymondo BandKoval and left message on HCPOA voice mail (see dementia problem list note), and would probably leave Remeron on until other meds are titrated off, to prevent weight loss if this is helping with appetite.

## 2014-02-27 NOTE — Assessment & Plan Note (Addendum)
A: Chronic and stable, with some mild agitation / poor cooperation with exam, today, but overall little change for the past several months. Still on Aricept, Remeron, and Namenda but doubt pt is getting much benefit from them.   P: Discussed with Dr. Raymondo BandKoval and agree with trial off of Remeron / Namenda / Aricept.  Will defer specific orders for tapering to geriatrics team; current geriatrics resident Dr. Adriana Simasook aware and will route this note to him. Left message on HCPOA Mr. Archie PattenBurkhead's voice mail that we want to start tapering back on meds and that he can call if he has any questions and I will speak to him, then, if he does. Monitor clinically for any frank worsening, otherwise.

## 2014-02-28 ENCOUNTER — Encounter: Payer: Self-pay | Admitting: Pharmacist

## 2014-03-13 NOTE — Progress Notes (Signed)
Patient ID: Marcheta GrammesVallie Well, female   DOB: [birth year], 80 y.o.   MRN: [id number] I discussed with  Dr Casper HarrisonStreet.  I agree with their plans documented in their regulatory visit note.  In setting of Ms Melvyn NethLewis' Advanced Dementia, her medication regiment burden is being slowly reduced.    Seroquel has been stopped without significant adverse events.    Her Donepezil has been tapered and will be stopped today.   If significant decline in function, cognition or behavior occurs, then recommend restart of Donepezil.    If do not need to restart Donepezil, then will stop Memantine (does not need a taper), and observe for changes again.

## 2014-03-18 ENCOUNTER — Telehealth: Payer: Self-pay | Admitting: Family Medicine

## 2014-03-18 NOTE — Telephone Encounter (Signed)
Note -- INITIAL PHONE CALL DOCUMENTED UNDER HCPOA'S CHART [place](James C Burkhead, DOB [birth year]). Returned phone call from Mr. Merlinda FrederickBurkhead (pt's HCPOA) to discuss medication tapers/discontinuation, specifically stopping Aricept and Namenda. He is in agreement to stop medications that pt may not be receiving much benefit from, from a standpoint of side effects and also cost. He states that he is in agreement now and also defers further decisions to her providers (myself and the geriatrics team) and states he is always available to discuss these changes if needed. He has no specific questions at this time. He expressed understanding and appreciation for the call. --CMS  Routed FYI to Dr. Perley JainMcDiarmid, Dr. Raymondo BandKoval, and current geriatrics resident Dr. Adriana Simasook.

## 2014-04-19 ENCOUNTER — Encounter: Payer: Self-pay | Admitting: Pharmacist

## 2014-04-30 ENCOUNTER — Encounter: Payer: Self-pay | Admitting: Family Medicine

## 2014-04-30 ENCOUNTER — Non-Acute Institutional Stay: Payer: Medicare Other | Admitting: Family Medicine

## 2014-04-30 DIAGNOSIS — F0391 Unspecified dementia with behavioral disturbance: Secondary | ICD-10-CM

## 2014-04-30 DIAGNOSIS — I6930 Unspecified sequelae of cerebral infarction: Secondary | ICD-10-CM

## 2014-04-30 DIAGNOSIS — F03918 Unspecified dementia, unspecified severity, with other behavioral disturbance: Secondary | ICD-10-CM

## 2014-04-30 DIAGNOSIS — I480 Paroxysmal atrial fibrillation: Secondary | ICD-10-CM

## 2014-04-30 NOTE — Progress Notes (Signed)
Patient ID: Shannon Stokes, female   DOB: 27-Nov-1916, 81 y.o.   MRN: [id number] [facility]  Visit  Provider: Burnis Medinhris Street, MD Location of Care: [facility] Visit Information: a scheduled routine follow-up visit Patient accompanied by patient Source(s) of information for visit: patient, nursing home and past medical records  Chief Complaint:  Chief Complaint  Patient presents with  . Regulatory Visit    HISTORY OF PRESENT ILLNESS: Problem List Items Addressed This Visit      Intermittent atrial fibrillation  - diagnosed during hospitalization June 2014 for CVA - On Eliquis  - Patient deneis palpitations and bleeding - Patient able to participate in assissted ADLs.    History of stroke with residual effects - Diagnosed June 2014 - Associated Afib - No new symptoms of numbness or weakness in face or limbs   Dementia with behavioral disturbance - Patient off Seroquel, Namenda, Donepezil - No complaint from nursing staff about agitated behaviors - Patient has language greater than half dozen words.  She needs assistance in all ADLs except eating. .         Outpatient Encounter Prescriptions as of 04/30/2014  Medication Sig  . acetaminophen (TYLENOL) 325 MG tablet Take 650 mg by mouth 4 (four) times daily.  . Amino Acids-Protein Hydrolys (FEEDING SUPPLEMENT, PRO-STAT SUGAR FREE 64,) LIQD Take 30 mLs by mouth daily before lunch.  Marland Kitchen. apixaban (ELIQUIS) 2.5 MG TABS tablet Take 2.5 mg by mouth 2 (two) times daily.  Marland Kitchen. atorvastatin (LIPITOR) 20 MG tablet Take 20 mg by mouth daily.  . calcium citrate (CALCITRATE - DOSED IN MG ELEMENTAL CALCIUM) 950 MG tablet Take 2 tablets by mouth daily.  . Multiple Vitamins-Minerals (MULTIVITAMIN PO) Take 1 tablet by mouth daily.  . Vitamin D, Ergocalciferol, (DRISDOL) 50000 UNITS CAPS capsule Take 50,000 Units by mouth every 30 (thirty) days.  . [DISCONTINUED] mirtazapine (REMERON) 15 MG tablet Take 15 mg by mouth at bedtime.    History Patient Active Problem List   Diagnosis Date Noted  . Insomnia due to medical condition 03/01/2013  . Fall at nursing home 12/05/2012  . Intermittent atrial fibrillation 11/26/2012  . History of stroke with residual effects 11/24/2012  . Hemiplegia, unspecified, affecting nondominant side 11/24/2012  . Personal history of fall 08/22/2012  . Long toenail 08/21/2012  . Dementia with behavioral disturbance 03/11/2010  . GAIT IMBALANCE 03/11/2010  . HTN (hypertension) 03/11/2010   Past Medical History  Diagnosis Date  . Hypertension   . Stroke   . Dementia with behavioral disturbance 03/11/2010   Past Surgical History  Procedure Laterality Date  . Appendectomy    . Ankle surgery      "as a child"   No family history on file.  reports that she has never smoked. She does not have any smokeless tobacco history on file. She reports that she does not drink alcohol. History   Social History Narrative   -Pt is a never smoker, and denies EtOH or other illicit substance use.    -Mr. Lucilla EdinJames Burkhead is pt's HCPOA [facility](Home [phone], Cell [phone])   -Pt has a form stating her "natural death desires," which in summary states that she would not want life-prolonging measures in the event of a terminal/vegetative state.    -Pt has DNR form on chart from Dec 2014 (signed by Dr. Armen PickupFunches)            Basic Activities of Daily Living  Dependent or needs assistance in feeding, dressing, bathing and toileting  Falls in the past six months:   no  Diet:  Dysphagia Supplemental shakes:  yes  Review of Systems  See HPI General: Denies chills Eyes: problems with vision.  Cardiovascular: Denies complaints, chest pains, palpitations, dyspnea on exertion, peripheral edema.  Respiratory: Denies cough.  Gastrointestinal: Denies constipation  Genitourinary: Denies dysuria, urinary frequency, discharge, pelvic pain PHYSICAL EXAM:. Wt Readings from Last 3 Encounters:  04/15/14 139  lb (63.05 kg)  01/13/14 138 lb 6.4 oz (62.778 kg)  10/17/13 135 lb 3.2 oz (61.326 kg)   Temp Readings from Last 3 Encounters:  04/28/14 98 F (36.7 C)   02/25/14 98.4 F (36.9 C)   02/02/14 97.2 F (36.2 C)    BP Readings from Last 3 Encounters:  04/28/14 118/69  02/25/14 134/68  02/02/14 128/72   Pulse Readings from Last 3 Encounters:  04/28/14 82  02/25/14 74  02/02/14 60    General: No acute distress, sitting in WC in common room well nourished, pleasant, clean, groomed HEENT:  Oromucosa moist and no erythema or lesion, fair dentition Neck:  Supple, No JVD, no lymphadenopathy CV:  RRR with regular premature beat every fourth beat, no murmur, no ankle swelling RESP: No resp distress or accessory muscle use.  Clear to ausc bilat. No wheezing, no rales, no rhonchi.  ABD:  Soft, Non-tender, non-distended, +bowel sounds, no masses MSK:  No back pain, no joint pain.  No joint swelling or redness EXT:      RUE:  5/5 strength, normal ROM at shoulder, elbow, wrist; able to abduct and flex shoulder to aout 100 degrees  LUE:  5/5 strength, normal ROM at shoulder, elbow, wrist;able to abduct and flex shoulder to aout 100 degrees    RLE:  5/5 strength, normal ROM  ankle  LLE:  5/5 strength, normal ROM ankle              Psych: Attentive during visit, offered cookies to examiner, cooperative, following one step requests.  Recent memory impaired as patient could not recall where she had been before I came to get her for the exam.    MMSE or MoCa:  16 / 30 (09/2012)  Years of Education: 7th grade  Assessment and Plan:   Problem List Items Addressed This Visit    Intermittent atrial fibrillation - In regular rate and rhythm with regular premature beat - Stable - Tolerating Anticoagulation therapy. Continue Eliquis   History of stroke with residual effects - Stable.  No new neurologic symptoms / signs. - Recommend continuation of Atorvastatin as it is for secondary stroke  prevention, not primary Which has less evidence of benefit than secondary prevention in older adults.    Dementia with behavioral disturbance - Stable - No behavioral disturbances currently while off antipsychotic and antidementia medications. - Stage 6 (moderate-to-severe) dementia.  Has preservation of language and social skills. - Palliative Performance Scale: 40% to 50% - No new interventions recommended at this time.        Code Status:    DNR HCPOA has an advance directive - a copy HAS NOT been provided. Emergency contact:  Mr. Lucilla Edin is pt's HCPOA [facility] [phone], Cell [phone]) Follow Up:  Next 60 days unless acute issues arise.

## 2014-05-02 ENCOUNTER — Non-Acute Institutional Stay (INDEPENDENT_AMBULATORY_CARE_PROVIDER_SITE_OTHER): Payer: Medicare Other | Admitting: Family Medicine

## 2014-05-02 ENCOUNTER — Encounter: Payer: Self-pay | Admitting: Family Medicine

## 2014-05-02 DIAGNOSIS — F03918 Unspecified dementia, unspecified severity, with other behavioral disturbance: Secondary | ICD-10-CM

## 2014-05-02 DIAGNOSIS — I10 Essential (primary) hypertension: Secondary | ICD-10-CM

## 2014-05-02 DIAGNOSIS — I480 Paroxysmal atrial fibrillation: Secondary | ICD-10-CM

## 2014-05-02 DIAGNOSIS — I6930 Unspecified sequelae of cerebral infarction: Secondary | ICD-10-CM

## 2014-05-02 DIAGNOSIS — F0391 Unspecified dementia with behavioral disturbance: Secondary | ICD-10-CM

## 2014-05-02 NOTE — Assessment & Plan Note (Signed)
No new obvious neurologic changes, though pt does not cooperate with formal exam and interview is very limited. Continue Eliquis / Lipitor and monitor. Encourage physical activity as tolerated.

## 2014-05-02 NOTE — Assessment & Plan Note (Signed)
BP remains in goal range (<130/90, per neurologist with hx of pons stroke 11/2013), now off medications for several months. Plan to monitor clinically. Definite fall risk, so would allow some relatively permissive HTN, regardless.

## 2014-05-02 NOTE — Assessment & Plan Note (Signed)
A: Chronic / stable dementia, non-combative but not cooperative with exam / interview, today. Now off Aricept, Namenda, Remeron.  P: Continue to monitor off medications. Need to monitor weights.

## 2014-05-02 NOTE — Assessment & Plan Note (Signed)
NSR via clinical exam in past weeks / months, off beta blocker, though exam today not performed due to pt cooperation. Continue Eliquis, check BMP q6 months (last check October 2015). Monitor vitals regularly.

## 2014-05-02 NOTE — Progress Notes (Signed)
   Subjective:    Patient ID: Shannon Stokes, female    DOB: 1916/06/09, 81 y.o.   MRN: [id number]  HPI: Pt seen at [facility] for monthly visit. Pt was not receptive to interview and did not ignore questions, but did not answer them, either. She did state she has no pain or difficulty breathing. When asked if she had any needs, she shook her head and began to wheel herself away.  Pons stroke and waxing/waning dementia - no new descriptions of combative behavior from RN staff - continues on Remeron; tapered off Aricept and Namenda over the past two months  Afib - has been in NSR in recent months, denies pain but does not answer further questions - remains on Eliquis and Lipitor and continues off beta blocker for several months  HTN - remains off beta blocker for several months with BP's in goal range  Pt is a never smoker. In addition to the above documentation, pt's PMH, surgical history, FH, and SH all reviewed and updated where appropriate in the EMR. I have also reviewed and updated the pt's allergies and current medications as appropriate.  Review of Systems: As above. Not cooperative with questioning.     Objective:   Physical Exam BP 118/69 mmHg  Pulse 82  Temp(Src) 98 F (36.7 C)  Resp 18 Note vitals above are from 04/28/14 Gen: well-appearing elderly female, sitting in wheelchair in NAD, not cooperative with exam Pulm: normal work of breathing, does not allow exam for auscultation Cardio: does not allow auscultation for heart sounds Abd: does not allow full exam Ext: warm, well-perfused, no LE edema (palpated while attempting to ask questions to pt) Neuro: awake / alert; does not answer questions for orientation  No frank neuro deficit; does not allow exam for strength but able to propel self in wheelchair with hands / feet     Assessment & Plan:  See problem list notes.

## 2014-05-23 ENCOUNTER — Encounter: Payer: Self-pay | Admitting: Pharmacist

## 2014-05-30 ENCOUNTER — Other Ambulatory Visit: Payer: Self-pay | Admitting: Family Medicine

## 2014-06-27 ENCOUNTER — Encounter: Payer: Self-pay | Admitting: Family Medicine

## 2014-06-27 ENCOUNTER — Non-Acute Institutional Stay: Payer: PPO | Admitting: Family Medicine

## 2014-06-27 DIAGNOSIS — I480 Paroxysmal atrial fibrillation: Secondary | ICD-10-CM

## 2014-06-27 DIAGNOSIS — I10 Essential (primary) hypertension: Secondary | ICD-10-CM

## 2014-06-27 DIAGNOSIS — I6930 Unspecified sequelae of cerebral infarction: Secondary | ICD-10-CM

## 2014-06-27 DIAGNOSIS — G8190 Hemiplegia, unspecified affecting unspecified side: Secondary | ICD-10-CM

## 2014-06-27 DIAGNOSIS — F0391 Unspecified dementia with behavioral disturbance: Secondary | ICD-10-CM | POA: Diagnosis not present

## 2014-06-27 DIAGNOSIS — G8191 Hemiplegia, unspecified affecting right dominant side: Secondary | ICD-10-CM

## 2014-06-27 DIAGNOSIS — F03918 Unspecified dementia, unspecified severity, with other behavioral disturbance: Secondary | ICD-10-CM

## 2014-06-27 NOTE — Assessment & Plan Note (Signed)
A: Chronic / stable dementia without frank disruptive behavior in recent months. Now off Aricept, Namenda.  P: Continue to monitor off medications. Limit anticholinergics, primary concern is safety (pt is mobile in wheelchair by pedaling herself).

## 2014-06-27 NOTE — Progress Notes (Signed)
   Subjective:    Patient ID: Shannon Stokes, female    DOB: 11-13-1916, 81 y.o.   MRN: [id number]  HPI: Pt seen at [facility] for [facility] visit. Pt more pleasant than last visit, but does not always answer questions appropriately (pleasantly confused). States she has no specific pain. Seen at lunch, but pt states she is not hungry. Pt denied any needs, currently and states she does not recognize me (has not recognized me in many months, now).  Pons stroke and waxing/waning dementia - no new descriptions of combative behavior from RN staff - continues on Remeron; now off Aricept and Namenda without significant change in behaviors  Afib - remains clinically in NSR; denies chest pain or palpitations - remains on Eliquis and Lipitor and continues off beta blocker for several months  HTN - remains off beta blocker for several months with BP's in goal range (occasionally very mild elevation; see A&P)  Pt is a never smoker. In addition to the above documentation, pt's PMH, surgical history, FH, and SH all reviewed and updated where appropriate in the EMR. I have also reviewed and updated the pt's allergies and current medications as appropriate.  Review of Systems: As above. Not cooperative with questioning.     Objective:   Physical Exam BP 147/74 mmHg  Pulse 67  Temp(Src) 96.8 F (36 C)  Resp 20 Note vitals above are from 3/12 Gen: well-appearing elderly female, sitting in wheelchair in NAD Pulm: normal work of breathing, shallow breaths but generally clear to auscultation Cardio: hearts slightly distant but regular rate and rhythm, clinically, no murmur appreciated Abd: soft, nontender; exam limited by sitting in wheelchair; BS+ Ext: warm, well-perfused, no LE edema Neuro: awake / alert; does not answer questions appropriately for orientation  No frank new neuro deficit     Assessment & Plan:  See problem list notes.

## 2014-06-27 NOTE — Assessment & Plan Note (Signed)
Goal 130/90 per neurologist consultation in hospital 11/2012 for stroke; minimal elevation at last vital check. Would prefer to stay off medications, for now, and monitor. Would consider very low dose of beta blocker if anything (pt tolerated this well in the past, and does have paroxysmal afib).

## 2014-06-27 NOTE — Assessment & Plan Note (Signed)
NSR via clinical exam over the past several months to 1 year; previously on beta blocker. Continue Eliquis. Consider BMP for kidney function. Monitor vitals regularly; consider re-addition of beta blocker if needed for HTN and dual effect of rate control for a-fib (has not had tachycardia in recent months).

## 2014-06-27 NOTE — Assessment & Plan Note (Signed)
No obvious new neurologic changes, though pt is not entirely cooperative with exam and history is unreliable. Continue Lipitor, Eliquis. Monitor for new deficits or bleeding. Encourage physical activity as tolerated / able.

## 2014-06-27 NOTE — Assessment & Plan Note (Signed)
Weakness secondary to pons stroke August 2014. Pt nonambulatory at baseline.

## 2014-07-01 ENCOUNTER — Encounter: Payer: Self-pay | Admitting: Pharmacist

## 2014-07-17 NOTE — Progress Notes (Signed)
Patient ID: Shannon Stokes, female   DOB: 08/04/1916, 81 y.o.   MRN: [id number] I discussed with  Dr Casper HarrisonStreet.  I agree with their plans documented in their regulatory visit  note.

## 2014-07-17 NOTE — Addendum Note (Signed)
Addended byPerley Jain: Shon Indelicato D on: 07/17/2014 11:22 AM   Modules accepted: Level of Service

## 2014-07-19 ENCOUNTER — Non-Acute Institutional Stay: Payer: PPO | Admitting: Family Medicine

## 2014-07-19 ENCOUNTER — Encounter: Payer: Self-pay | Admitting: Family Medicine

## 2014-07-19 DIAGNOSIS — F03918 Unspecified dementia, unspecified severity, with other behavioral disturbance: Secondary | ICD-10-CM

## 2014-07-19 DIAGNOSIS — F0391 Unspecified dementia with behavioral disturbance: Secondary | ICD-10-CM | POA: Diagnosis not present

## 2014-07-19 NOTE — Progress Notes (Signed)
Patient ID: Shannon Stokes, female   DOB: 11-22-1916, 81 y.o.   MRN: [id number] [facility]  Visit  Provider: Burnis Medin, MD Location of Care: [facility] Visit Information: a scheduled routine follow-up visit Patient accompanied by patient Source(s) of information for visit: patient, nursing home and past medical records  Chief Complaint:  Chief Complaint  Patient presents with  . Regulatory Visit    HISTORY OF PRESENT ILLNESS: Problem List Items Addressed This Visit      Intermittent atrial fibrillation  - diagnosed during hospitalization June 2014 for CVA - On Eliquis  - Patient deneis palpitations and bleeding - Patient able to participate in assisted ADLs.    History of stroke with residual effects - Diagnosed June 2014 - Associated Afib - No new symptoms of numbness or weakness in face or limbs   Dementia with behavioral disturbance - Patient off Seroquel, Namenda, Donepezil - No complaint from nursing staff about agitated behaviors - Patient has language greater than half dozen words.  She needs assistance in all ADLs except eating. .     Outpatient Encounter Prescriptions as of 07/19/2014  Medication Sig  . acetaminophen (TYLENOL) 325 MG tablet Take 650 mg by mouth 3 (three) times daily.  . Amino Acids-Protein Hydrolys (FEEDING SUPPLEMENT, PRO-STAT SUGAR FREE 64,) LIQD Take 30 mLs by mouth daily before lunch.  Marland Kitchen apixaban (ELIQUIS) 2.5 MG TABS tablet Take 2.5 mg by mouth 2 (two) times daily.  Marland Kitchen atorvastatin (LIPITOR) 20 MG tablet Take 20 mg by mouth daily.  . calcium citrate (CALCITRATE - DOSED IN MG ELEMENTAL CALCIUM) 950 MG tablet Take 2 tablets by mouth daily.  . mirtazapine (REMERON) 15 MG tablet Take 15 mg by mouth at bedtime.  . Multiple Vitamins-Minerals (MULTIVITAMIN PO) Take 1 tablet by mouth daily.  . Vitamin D, Ergocalciferol, (DRISDOL) 50000 UNITS CAPS capsule Take 50,000 Units by mouth every 30 (thirty) days.   History Patient  Active Problem List   Diagnosis Date Noted  . Insomnia due to medical condition 03/01/2013  . Fall at nursing home 12/05/2012  . Intermittent atrial fibrillation 11/26/2012  . History of stroke with residual effects 11/24/2012  . Hemiplegia affecting nondominant side 11/24/2012  . Personal history of fall 08/22/2012  . Long toenail 08/21/2012  . Dementia with behavioral disturbance 03/11/2010  . GAIT IMBALANCE 03/11/2010  . HTN (hypertension) 03/11/2010   Past Medical History  Diagnosis Date  . Hypertension   . Stroke   . Dementia with behavioral disturbance 03/11/2010   Past Surgical History  Procedure Laterality Date  . Appendectomy    . Ankle surgery      "as a child"   No family history on file.  reports that she has never smoked. She does not have any smokeless tobacco history on file. She reports that she does not drink alcohol. History   Social History Narrative   -Pt is a never smoker, and denies EtOH or other illicit substance use.    -Mr. Lucilla Edin is pt's HCPOA [facility] [phone], Cell [phone])   -Pt has a form stating her "natural death desires," which in summary states that she would not want life-prolonging measures in the event of a terminal/vegetative state.    -Pt has DNR form on chart from Dec 2014 (signed by Dr. Armen Pickup)   Basic Activities of Daily Living  Dependent or needs assistance in feeding, dressing, bathing and toileting   Falls in the past six months:   no  Diet:  Dysphagia  Supplemental shakes:  yes  Review of Systems  See HPI General: Denies chills Eyes: problems with vision.  Cardiovascular: Denies complaints, chest pains, palpitations, dyspnea on exertion, peripheral edema.  Respiratory: Denies cough.  Gastrointestinal: Denies constipation  Genitourinary: Denies dysuria, urinary frequency, discharge, pelvic pain PHYSICAL EXAM:. Wt Readings from Last 3 Encounters:  07/17/14 141 lb 12.8 oz (64.32 kg)  04/15/14 139 lb (63.05  kg)  01/13/14 138 lb 6.4 oz (62.778 kg)   Temp Readings from Last 3 Encounters:  06/22/14 96.8 F (36 C)   04/28/14 98 F (36.7 C)   04/28/14 98 F (36.7 C)    BP Readings from Last 3 Encounters:  07/06/14 125/69  06/22/14 147/74  04/28/14 118/69   Pulse Readings from Last 3 Encounters:  07/13/14 65  06/22/14 67  04/28/14 82    General: No acute distress, sitting in WC in common room well nourished, pleasant, clean, groomed HEENT:  Oromucosa moist and no erythema or lesion, fair dentition Neck:  Supple, No JVD, no lymphadenopathy CV:  RRR with regular premature beat every fourth beat, no murmur, no ankle swelling RESP: No resp distress or accessory muscle use.  Clear to ausc bilat. No wheezing, no rales, no rhonchi.  ABD:  Soft, Non-tender, non-distended, +bowel sounds, no masses MSK:  No back pain, no joint pain.  No joint swelling or redness Neuro: no increase muscle tone in arms and legs              Psych: Attentive during visit, offered cookies to examiner, cooperative, following one step requests.    MMSE or MoCa:  16 / 30 (09/2012)  Years of Education: 7th grade  Assessment and Plan:   Problem List Items Addressed This Visit    Intermittent atrial fibrillation - In regular rate and rhythm with regular premature beat - Stable - Tolerating Anticoagulation therapy. Continue Eliquis   History of stroke with residual effects - Stable.  No new neurologic symptoms / signs. - Recommend continuation of Atorvastatin as it is for secondary stroke prevention, not primary Which has less evidence of benefit than secondary prevention in older adults.    Dementia with behavioral disturbance - Stable - No behavioral disturbances currently while off antipsychotic and antidementia medications. - Stage 6 (moderate-to-severe) dementia.  Has preservation of language and social skills. - Palliative Performance Scale: 40% to 50% - No new interventions recommended at this time.         Code Status:    DNR HCPOA has an advance directive - a copy HAS NOT been provided. Emergency contact:  Shannon Stokes is pt's HCPOA [facility](Home [phone], Cell [phone]) Follow Up:  Next 60 days unless acute issues arise.

## 2014-08-02 ENCOUNTER — Encounter: Payer: Self-pay | Admitting: Pharmacist

## 2014-08-27 ENCOUNTER — Non-Acute Institutional Stay (INDEPENDENT_AMBULATORY_CARE_PROVIDER_SITE_OTHER): Payer: PPO | Admitting: Family Medicine

## 2014-08-27 ENCOUNTER — Encounter: Payer: Self-pay | Admitting: Family Medicine

## 2014-08-27 DIAGNOSIS — F0391 Unspecified dementia with behavioral disturbance: Secondary | ICD-10-CM

## 2014-08-27 DIAGNOSIS — G8190 Hemiplegia, unspecified affecting unspecified side: Secondary | ICD-10-CM

## 2014-08-27 DIAGNOSIS — I10 Essential (primary) hypertension: Secondary | ICD-10-CM

## 2014-08-27 DIAGNOSIS — I480 Paroxysmal atrial fibrillation: Secondary | ICD-10-CM

## 2014-08-27 DIAGNOSIS — F03918 Unspecified dementia, unspecified severity, with other behavioral disturbance: Secondary | ICD-10-CM

## 2014-08-27 DIAGNOSIS — I6930 Unspecified sequelae of cerebral infarction: Secondary | ICD-10-CM

## 2014-08-27 DIAGNOSIS — G8191 Hemiplegia, unspecified affecting right dominant side: Secondary | ICD-10-CM

## 2014-08-27 NOTE — Assessment & Plan Note (Signed)
A: No new obvious neuro changes. Not cooperative with full exam and unable to give accurate history due to dementia. No signs / symptoms of bleeding on Eliquis.  P: Continue Lipitor for secondary prevention. Continue Eliquis. Monitor clinically. Encourage activity as tolerated.

## 2014-08-27 NOTE — Assessment & Plan Note (Signed)
A: Chronic / stable dementia without behavioral disturbance for several months. No longer on Aricept or Namenda. Off Seroquel for many months. No recent falls.  P: Continue to monitor off medications. Pt is mobile in wheelchair propelling herself with her feet, so safety is primary concern. Limit anticholinergics.

## 2014-08-27 NOTE — Progress Notes (Signed)
   Subjective:    Patient ID: Shannon Stokes, female    DOB: [birth year], 81 y.o.   MRN: [id number]  HPI: Pt seen at [facility] for [facility] visit. Pt remains pleasantly confused and does not always answer questions appropriately. States she has no specific pain. Pt denied any needs / current complaints. She again states she does not recognize me (has not recognized me in many months, now).  Pons stroke and waxing/waning dementia - no recent combative behavior - continues on Remeron, weight stable ~140 - now off Aricept and Namenda without significant change in behaviors  Afib - remains clinically in NSR; denies chest pain or palpitations - remains on Eliquis and Lipitor - continues off beta blocker for several months  HTN - remains off beta blocker for several months with BP's in goal range  Pt is a never smoker. In addition to the above documentation, pt's PMH, surgical history, FH, and SH all reviewed and updated where appropriate in the EMR. I have also reviewed and updated the pt's allergies and current medications as appropriate.  Review of Systems: As above. Unable to provide reliable ROS, otherwise, secondary to dementia.     Objective:   Physical Exam BP 130/70 mmHg  Pulse 98  Temp(Src) 97.5 F (36.4 C)  Resp 18 Note vitals above are from 5/14 Gen: well-appearing elderly female, sitting in wheelchair in NAD Pulm: normal work of breathing, generally clear to auscultation Cardio: heart sounds distant but no murmur appreciated  Clinically regular rhythm Abd: soft, nontender; exam limited by sitting in wheelchair; BS+ Ext: warm, well-perfused, no LE edema Neuro: awake / alert; does not answer questions appropriately for orientation  No frank new neuro deficits  Strength intact in grip, bilaterally     Assessment & Plan:  See problem list notes.  Reviewed pt's chart. She is a DNR. Portable DNR sheet present on [facility] paper [facility] at [facility]. HCPOA Mr.  Ronda FairlyJimmy Burkhead is available by phone; last spoke with him several months ago (see phone note) and there have been no major changes since that time. Will speak with HCPOA if / when the need arises or refer geriatrics team to him as needed.  Shannon Mortonhristopher M Yerlin Gasparyan, MD PGY-3, [facility] 08/27/2014, 6:50 PM

## 2014-08-27 NOTE — Assessment & Plan Note (Signed)
Has been in clinical NSR via auscultation for several months. Previously on beta blocker, now only on Eliquis. Would minimize lab draws / testing unless indicated (?BMP yearly, EKG only if frank symptoms arise). Could re-add metoprolol if rate becomes an issue.

## 2014-08-27 NOTE — Assessment & Plan Note (Signed)
BP generally at goal off medications. Monitor clinically -- would allow mild permissive HTN as opposed to aggressive medication management given high risk of falls. Could re-add metoprolol in the future (did fairly well with this in the past).

## 2014-08-27 NOTE — Assessment & Plan Note (Signed)
Weakness secondary to pons stroke in August 2014. Non-ambulatory, at baseline. Grip strength intact. Monitor for new neuro deficits but given age and dementia, she is not likely a good candidate for any aggressive interventions, regardless.

## 2014-08-30 NOTE — Progress Notes (Signed)
I was available as preceptor to resident for this patient's office visit.  

## 2014-09-02 ENCOUNTER — Encounter: Payer: Self-pay | Admitting: Student-PharmD

## 2014-10-25 ENCOUNTER — Emergency Department (HOSPITAL_COMMUNITY): Payer: PPO

## 2014-10-25 ENCOUNTER — Emergency Department (HOSPITAL_COMMUNITY)
Admission: EM | Admit: 2014-10-25 | Discharge: 2014-10-25 | Disposition: A | Discharge status: Home / Self Care | Payer: PPO | Attending: Emergency Medicine | Admitting: Emergency Medicine

## 2014-10-25 ENCOUNTER — Encounter (HOSPITAL_COMMUNITY): Payer: Self-pay | Admitting: *Deleted

## 2014-10-25 DIAGNOSIS — W050XXA Fall from non-moving wheelchair, initial encounter: Secondary | ICD-10-CM | POA: Diagnosis not present

## 2014-10-25 DIAGNOSIS — S79912A Unspecified injury of left hip, initial encounter: Secondary | ICD-10-CM | POA: Insufficient documentation

## 2014-10-25 DIAGNOSIS — W19XXXA Unspecified fall, initial encounter: Secondary | ICD-10-CM

## 2014-10-25 DIAGNOSIS — Y92128 Other place in nursing home as the place of occurrence of the external cause: Secondary | ICD-10-CM | POA: Insufficient documentation

## 2014-10-25 DIAGNOSIS — S79911A Unspecified injury of right hip, initial encounter: Secondary | ICD-10-CM | POA: Insufficient documentation

## 2014-10-25 DIAGNOSIS — S0083XA Contusion of other part of head, initial encounter: Secondary | ICD-10-CM | POA: Diagnosis not present

## 2014-10-25 DIAGNOSIS — S0031XA Abrasion of nose, initial encounter: Secondary | ICD-10-CM | POA: Diagnosis not present

## 2014-10-25 DIAGNOSIS — Z8673 Personal history of transient ischemic attack (TIA), and cerebral infarction without residual deficits: Secondary | ICD-10-CM | POA: Insufficient documentation

## 2014-10-25 DIAGNOSIS — Y9389 Activity, other specified: Secondary | ICD-10-CM | POA: Diagnosis not present

## 2014-10-25 DIAGNOSIS — I10 Essential (primary) hypertension: Secondary | ICD-10-CM | POA: Insufficient documentation

## 2014-10-25 DIAGNOSIS — Y998 Other external cause status: Secondary | ICD-10-CM | POA: Insufficient documentation

## 2014-10-25 DIAGNOSIS — F0391 Unspecified dementia with behavioral disturbance: Secondary | ICD-10-CM | POA: Insufficient documentation

## 2014-10-25 DIAGNOSIS — Z79899 Other long term (current) drug therapy: Secondary | ICD-10-CM | POA: Insufficient documentation

## 2014-10-25 DIAGNOSIS — Z7902 Long term (current) use of antithrombotics/antiplatelets: Secondary | ICD-10-CM | POA: Insufficient documentation

## 2014-10-25 DIAGNOSIS — S0990XA Unspecified injury of head, initial encounter: Secondary | ICD-10-CM | POA: Diagnosis present

## 2014-10-25 LAB — BASIC METABOLIC PANEL
ANION GAP: 10 (ref 5–15)
BUN: 24 mg/dL — ABNORMAL HIGH (ref 6–20)
CHLORIDE: 109 mmol/L (ref 101–111)
CO2: 23 mmol/L (ref 22–32)
Calcium: 9.9 mg/dL (ref 8.9–10.3)
Creatinine, Ser: 0.97 mg/dL (ref 0.44–1.00)
GFR calc non Af Amer: 48 mL/min — ABNORMAL LOW (ref >60)
GFR, EST AFRICAN AMERICAN: 55 mL/min — AB (ref >60)
Glucose, Bld: 108 mg/dL — ABNORMAL HIGH (ref 65–99)
POTASSIUM: 4.3 mmol/L (ref 3.5–5.1)
Sodium: 142 mmol/L (ref 135–145)

## 2014-10-25 LAB — CBC WITH DIFFERENTIAL/PLATELET
Basophils Absolute: 0.1 10*3/uL (ref 0.0–0.1)
Basophils Relative: 0 % (ref 0–1)
Eosinophils Absolute: 0.1 10*3/uL (ref 0.0–0.7)
Eosinophils Relative: 1 % (ref 0–5)
HEMATOCRIT: 34.8 % — AB (ref 36.0–46.0)
Hemoglobin: 11.1 g/dL — ABNORMAL LOW (ref 12.0–15.0)
Lymphocytes Relative: 12 % (ref 12–46)
Lymphs Abs: 2.2 10*3/uL (ref 0.7–4.0)
MCH: 27.8 pg (ref 26.0–34.0)
MCHC: 31.9 g/dL (ref 30.0–36.0)
MCV: 87 fL (ref 78.0–100.0)
MONOS PCT: 10 % (ref 3–12)
Monocytes Absolute: 1.8 10*3/uL — ABNORMAL HIGH (ref 0.1–1.0)
NEUTROS ABS: 14.1 10*3/uL — AB (ref 1.7–7.7)
Neutrophils Relative %: 77 % (ref 43–77)
Platelets: 257 10*3/uL (ref 150–400)
RBC: 4 MIL/uL (ref 3.87–5.11)
RDW: 13.5 % (ref 11.5–15.5)
WBC: 18.2 10*3/uL — ABNORMAL HIGH (ref 4.0–10.5)

## 2014-10-25 LAB — URINALYSIS, ROUTINE W REFLEX MICROSCOPIC
Bilirubin Urine: NEGATIVE
GLUCOSE, UA: NEGATIVE mg/dL
Ketones, ur: NEGATIVE mg/dL
LEUKOCYTES UA: NEGATIVE
NITRITE: POSITIVE — AB
Protein, ur: NEGATIVE mg/dL
Specific Gravity, Urine: 1.024 (ref 1.005–1.030)
Urobilinogen, UA: 1 mg/dL (ref 0.0–1.0)
pH: 5 (ref 5.0–8.0)

## 2014-10-25 LAB — URINE MICROSCOPIC-ADD ON

## 2014-10-25 LAB — PROTIME-INR
INR: 1.36 (ref 0.00–1.49)
Prothrombin Time: 16.8 seconds — ABNORMAL HIGH (ref 11.6–15.2)

## 2014-10-25 MED ORDER — SODIUM CHLORIDE 0.9 % IV BOLUS (SEPSIS)
500.0000 mL | Freq: Once | INTRAVENOUS | Status: AC
  Administered 2014-10-25: 500 mL via INTRAVENOUS

## 2014-10-25 MED ORDER — ACETAMINOPHEN 325 MG PO TABS
650.0000 mg | ORAL_TABLET | Freq: Once | ORAL | Status: AC
  Administered 2014-10-25: 650 mg via ORAL
  Filled 2014-10-25: qty 2

## 2014-10-25 MED ORDER — LEVOFLOXACIN 500 MG PO TABS: 500.0000 mg | ORAL_TABLET | Freq: Every day | ORAL | Status: DC

## 2014-10-25 NOTE — ED Notes (Signed)
Transported to radiology 

## 2014-10-25 NOTE — ED Notes (Signed)
Unsuccessful atempt to get cathed.  Pt fighting and kicking with 3 people trying to hold her .  Large  Stool brown cleaned and diaper replaced

## 2014-10-25 NOTE — ED Provider Notes (Signed)
CSN: [id number]     Arrival date & time 10/25/14  1353 History   First MD Initiated Contact with Patient 10/25/14 1401     Chief Complaint  Patient presents with  . Fall  . Head Injury     (Consider location/radiation/quality/duration/timing/severity/associated sxs/prior Treatment) Patient is a 81 y.o. female presenting with fall and head injury. The history is provided by the EMS personnel.  Fall This is a new problem. The current episode started 1 to 2 hours ago. Episode frequency: once. The problem has been resolved. Nothing aggravates the symptoms. Nothing relieves the symptoms. She has tried nothing for the symptoms. The treatment provided no relief.  Head Injury   Past Medical History  Diagnosis Date  . Hypertension   . Stroke   . Dementia with behavioral disturbance 03/11/2010   Past Surgical History  Procedure Laterality Date  . Appendectomy    . Ankle surgery      "as a child"   History reviewed. No pertinent family history. History  Substance Use Topics  . Smoking status: Never Smoker   . Smokeless tobacco: Not on file  . Alcohol Use: No   OB History    No data available     Review of Systems  Unable to perform ROS: Dementia      Allergies  Review of patient's allergies indicates no known allergies.  Home Medications   Prior to Admission medications   Medication Sig Start Date End Date Taking? Authorizing Provider  acetaminophen (TYLENOL) 325 MG tablet Take 650 mg by mouth 3 (three) times daily.   Yes Historical Provider, MD  Amino Acids-Protein Hydrolys (FEEDING SUPPLEMENT, PRO-STAT SUGAR FREE 64,) LIQD Take 30 mLs by mouth daily before lunch.   Yes Historical Provider, MD  apixaban (ELIQUIS) 2.5 MG TABS tablet Take 2.5 mg by mouth 2 (two) times daily.   Yes Historical Provider, MD  atorvastatin (LIPITOR) 20 MG tablet Take 20 mg by mouth daily.   Yes Historical Provider, MD  calcium citrate (CALCITRATE - DOSED IN MG ELEMENTAL CALCIUM) 950 MG tablet  Take 2 tablets by mouth daily.   Yes Historical Provider, MD  mirtazapine (REMERON) 15 MG tablet Take 15 mg by mouth at bedtime.   Yes Historical Provider, MD  Vitamin D, Ergocalciferol, (DRISDOL) 50000 UNITS CAPS capsule Take 50,000 Units by mouth every 30 (thirty) days.   Yes Historical Provider, MD   BP 153/55 mmHg  Pulse 67  Temp(Src) 98.3 F (36.8 C) (Oral)  Resp 19  SpO2 96% Physical Exam  Constitutional: She appears well-developed and well-nourished.  HENT:  Mouth/Throat: No oropharyngeal exudate.  Abrasion mild hematoma to central forehead. Also mild abrasion to the bridge of the nose.  Eyes: Conjunctivae and EOM are normal. Pupils are equal, round, and reactive to light.  Neck: Normal range of motion. Neck supple.  No obvious focal vertebral tenderness.  Cardiovascular: Normal rate, regular rhythm, normal heart sounds and intact distal pulses.  Exam reveals no gallop and no friction rub.   No murmur heard. Pulmonary/Chest: Effort normal and breath sounds normal. No respiratory distress. She has no wheezes.  Abdominal: Soft. Bowel sounds are normal. There is no tenderness. There is no rebound and no guarding.  Musculoskeletal: She exhibits tenderness. She exhibits no edema.  Pain with range of motion of the hips bilaterally.  Normal range of motion of the arms, elbows, wrists without significant pain.  2+ distal pulses in all extremities.  Neurological: She is alert.  A/o x1. Appears to  be moving all extremities  Skin: Skin is warm and dry.  Psychiatric: She has a normal mood and affect. Her behavior is normal.  Nursing note and vitals reviewed.   ED Course  Procedures (including critical care time) Labs Review Labs Reviewed  CBC WITH DIFFERENTIAL/PLATELET - Abnormal; Notable for the following:    WBC 18.2 (*)    Hemoglobin 11.1 (*)    HCT 34.8 (*)    Neutro Abs 14.1 (*)    Monocytes Absolute 1.8 (*)    All other components within normal limits  BASIC METABOLIC  PANEL - Abnormal; Notable for the following:    Glucose, Bld 108 (*)    BUN 24 (*)    GFR calc non Af Amer 48 (*)    GFR calc Af Amer 55 (*)    All other components within normal limits  URINALYSIS, ROUTINE W REFLEX MICROSCOPIC (NOT AT [facility]) - Abnormal; Notable for the following:    Hgb urine dipstick SMALL (*)    Nitrite POSITIVE (*)    All other components within normal limits  PROTIME-INR - Abnormal; Notable for the following:    Prothrombin Time 16.8 (*)    All other components within normal limits  URINE MICROSCOPIC-ADD ON - Abnormal; Notable for the following:    Bacteria, UA FEW (*)    Crystals URIC ACID CRYSTALS (*)    All other components within normal limits  URINE CULTURE    Imaging Review Dg Chest 2 View  10/25/2014   CLINICAL DATA:  Fall.  Previous stroke.  EXAM: CHEST  2 VIEW  COMPARISON:  11/25/2012  FINDINGS: Mild cardiomegaly remains stable. Asymmetric airspace opacity is seen in the left mid and lower lung as well as a central right upper lobe, which may be due to infection, hemorrhage, or asymmetric edema. No evidence of pleural effusion.  IMPRESSION: Asymmetric airspace disease involving left mid and lower lung and central right upper lobe. Differential considerations include infection, hemorrhage, and asymmetric edema.  Stable mild cardiomegaly.   Electronically Signed   By: Myles Rosenthal M.D.   On: 10/25/2014 15:03   Dg Pelvis 1-2 Views  10/25/2014   CLINICAL DATA:  Fall.  Unable to give additional history.  EXAM: PELVIS - 1-2 VIEW  COMPARISON:  11/25/2012  FINDINGS: There is no evidence of pelvic fracture or diastasis. No pelvic bone lesions are seen. Degenerative changes are seen in the lower lumbar spine.  IMPRESSION: Negative.   Electronically Signed   By: Norva Pavlov M.D.   On: 10/25/2014 15:01   Ct Head Wo Contrast  10/25/2014   CLINICAL DATA:  Unwitnessed fall.  EXAM: CT HEAD WITHOUT CONTRAST  CT MAXILLOFACIAL WITHOUT CONTRAST  CT CERVICAL SPINE WITHOUT  CONTRAST  TECHNIQUE: Multidetector CT imaging of the head, cervical spine, and maxillofacial structures were performed using the standard protocol without intravenous contrast. Multiplanar CT image reconstructions of the cervical spine and maxillofacial structures were also generated.  COMPARISON:  11/23/2012  FINDINGS: CT HEAD FINDINGS  There is no evidence of mass effect, midline shift, or extra-axial fluid collections. There is no evidence of a space-occupying lesion or intracranial hemorrhage. There is no evidence of a cortical-based area of acute infarction. There is generalized cerebral atrophy. There is periventricular white matter low attenuation likely secondary to microangiopathy.  The ventricles and sulci are appropriate for the patient's age. The basal cisterns are patent.  Visualized portions of the orbits are unremarkable. There is bilateral ethmoid sinus mucosal thickening. There is a  small amount of fluid in the sphenoid sinus. Cerebrovascular atherosclerotic calcifications are noted.  The osseous structures are unremarkable. There is mild frontal scalp soft tissue swelling.  CT MAXILLOFACIAL FINDINGS  The globes are intact. The orbital walls are intact. The orbital floors are intact. The maxilla is intact. The mandible is intact. The zygomatic arches are intact. The nasal septum is midline. There is no nasal bone fracture. The temporomandibular joints are normal. There is a well corticated ossific fragment just posterior to the right mandibular condyle.  There is bilateral ethmoid sinus mucosal thickening. There is bilateral mild maxillary sinus mucosal thickening. There is small amount of fluid in the sphenoid sinus. The visualized portions of the mastoid sinuses are well aerated.  CT CERVICAL SPINE FINDINGS  The alignment is anatomic. The vertebral body heights are maintained. There is no acute fracture. There is 2 mm of retrolisthesis of C3 on C4. The prevertebral soft tissues are normal. The  intraspinal soft tissues are not fully imaged on this examination due to poor soft tissue contrast, but there is no gross soft tissue abnormality.  There is degenerative disc disease at C3-4, C5-6 and C6-7. Moderate bilateral facet arthropathy at C3-4. At severe left and moderate right facet arthropathy at C4-5. Moderate bilateral facet arthropathy at C5-6.  There is a 5 mm left upper lobe pulmonary nodule.  IMPRESSION: 1. No acute intracranial pathology. 2. No acute osseous injuries cervical spine. 3. No acute osseous injury of the maxillofacial bones. 4. Cervical spine spondylosis as described above. 5. 5 mm nonspecific left upper lobe pulmonary nodule. If the patient is at high risk for bronchogenic carcinoma, follow-up chest CT at 6-12 months is recommended. If the patient is at low risk for bronchogenic carcinoma, follow-up chest CT at 12 months is recommended. This recommendation follows the consensus statement: Guidelines for Management of Small Pulmonary Nodules Detected on CT Scans: A Statement from the Fleischner Society as published in Radiology 2005;237:395-400.   Electronically Signed   By: Elige KoHetal  Patel   On: 10/25/2014 15:38   Ct Cervical Spine Wo Contrast  10/25/2014   CLINICAL DATA:  Unwitnessed fall.  EXAM: CT HEAD WITHOUT CONTRAST  CT MAXILLOFACIAL WITHOUT CONTRAST  CT CERVICAL SPINE WITHOUT CONTRAST  TECHNIQUE: Multidetector CT imaging of the head, cervical spine, and maxillofacial structures were performed using the standard protocol without intravenous contrast. Multiplanar CT image reconstructions of the cervical spine and maxillofacial structures were also generated.  COMPARISON:  11/23/2012  FINDINGS: CT HEAD FINDINGS  There is no evidence of mass effect, midline shift, or extra-axial fluid collections. There is no evidence of a space-occupying lesion or intracranial hemorrhage. There is no evidence of a cortical-based area of acute infarction. There is generalized cerebral atrophy. There  is periventricular white matter low attenuation likely secondary to microangiopathy.  The ventricles and sulci are appropriate for the patient's age. The basal cisterns are patent.  Visualized portions of the orbits are unremarkable. There is bilateral ethmoid sinus mucosal thickening. There is a small amount of fluid in the sphenoid sinus. Cerebrovascular atherosclerotic calcifications are noted.  The osseous structures are unremarkable. There is mild frontal scalp soft tissue swelling.  CT MAXILLOFACIAL FINDINGS  The globes are intact. The orbital walls are intact. The orbital floors are intact. The maxilla is intact. The mandible is intact. The zygomatic arches are intact. The nasal septum is midline. There is no nasal bone fracture. The temporomandibular joints are normal. There is a well corticated ossific fragment just posterior to  the right mandibular condyle.  There is bilateral ethmoid sinus mucosal thickening. There is bilateral mild maxillary sinus mucosal thickening. There is small amount of fluid in the sphenoid sinus. The visualized portions of the mastoid sinuses are well aerated.  CT CERVICAL SPINE FINDINGS  The alignment is anatomic. The vertebral body heights are maintained. There is no acute fracture. There is 2 mm of retrolisthesis of C3 on C4. The prevertebral soft tissues are normal. The intraspinal soft tissues are not fully imaged on this examination due to poor soft tissue contrast, but there is no gross soft tissue abnormality.  There is degenerative disc disease at C3-4, C5-6 and C6-7. Moderate bilateral facet arthropathy at C3-4. At severe left and moderate right facet arthropathy at C4-5. Moderate bilateral facet arthropathy at C5-6.  There is a 5 mm left upper lobe pulmonary nodule.  IMPRESSION: 1. No acute intracranial pathology. 2. No acute osseous injuries cervical spine. 3. No acute osseous injury of the maxillofacial bones. 4. Cervical spine spondylosis as described above. 5. 5 mm  nonspecific left upper lobe pulmonary nodule. If the patient is at high risk for bronchogenic carcinoma, follow-up chest CT at 6-12 months is recommended. If the patient is at low risk for bronchogenic carcinoma, follow-up chest CT at 12 months is recommended. This recommendation follows the consensus statement: Guidelines for Management of Small Pulmonary Nodules Detected on CT Scans: A Statement from the Fleischner Society as published in Radiology 2005;237:395-400.   Electronically Signed   By: Elige Ko   On: 10/25/2014 15:38   Ct Maxillofacial Wo Cm  10/25/2014   CLINICAL DATA:  Unwitnessed fall.  EXAM: CT HEAD WITHOUT CONTRAST  CT MAXILLOFACIAL WITHOUT CONTRAST  CT CERVICAL SPINE WITHOUT CONTRAST  TECHNIQUE: Multidetector CT imaging of the head, cervical spine, and maxillofacial structures were performed using the standard protocol without intravenous contrast. Multiplanar CT image reconstructions of the cervical spine and maxillofacial structures were also generated.  COMPARISON:  11/23/2012  FINDINGS: CT HEAD FINDINGS  There is no evidence of mass effect, midline shift, or extra-axial fluid collections. There is no evidence of a space-occupying lesion or intracranial hemorrhage. There is no evidence of a cortical-based area of acute infarction. There is generalized cerebral atrophy. There is periventricular white matter low attenuation likely secondary to microangiopathy.  The ventricles and sulci are appropriate for the patient's age. The basal cisterns are patent.  Visualized portions of the orbits are unremarkable. There is bilateral ethmoid sinus mucosal thickening. There is a small amount of fluid in the sphenoid sinus. Cerebrovascular atherosclerotic calcifications are noted.  The osseous structures are unremarkable. There is mild frontal scalp soft tissue swelling.  CT MAXILLOFACIAL FINDINGS  The globes are intact. The orbital walls are intact. The orbital floors are intact. The maxilla is  intact. The mandible is intact. The zygomatic arches are intact. The nasal septum is midline. There is no nasal bone fracture. The temporomandibular joints are normal. There is a well corticated ossific fragment just posterior to the right mandibular condyle.  There is bilateral ethmoid sinus mucosal thickening. There is bilateral mild maxillary sinus mucosal thickening. There is small amount of fluid in the sphenoid sinus. The visualized portions of the mastoid sinuses are well aerated.  CT CERVICAL SPINE FINDINGS  The alignment is anatomic. The vertebral body heights are maintained. There is no acute fracture. There is 2 mm of retrolisthesis of C3 on C4. The prevertebral soft tissues are normal. The intraspinal soft tissues are not fully imaged on  this examination due to poor soft tissue contrast, but there is no gross soft tissue abnormality.  There is degenerative disc disease at C3-4, C5-6 and C6-7. Moderate bilateral facet arthropathy at C3-4. At severe left and moderate right facet arthropathy at C4-5. Moderate bilateral facet arthropathy at C5-6.  There is a 5 mm left upper lobe pulmonary nodule.  IMPRESSION: 1. No acute intracranial pathology. 2. No acute osseous injuries cervical spine. 3. No acute osseous injury of the maxillofacial bones. 4. Cervical spine spondylosis as described above. 5. 5 mm nonspecific left upper lobe pulmonary nodule. If the patient is at high risk for bronchogenic carcinoma, follow-up chest CT at 6-12 months is recommended. If the patient is at low risk for bronchogenic carcinoma, follow-up chest CT at 12 months is recommended. This recommendation follows the consensus statement: Guidelines for Management of Small Pulmonary Nodules Detected on CT Scans: A Statement from the Fleischner Society as published in Radiology 2005;237:395-400.   Electronically Signed   By: Elige Ko   On: 10/25/2014 15:38     EKG Interpretation   Date/Time:  Friday October 25 2014 15:55:55  EDT Ventricular Rate:  66 PR Interval:  163 QRS Duration: 88 QT Interval:  405 QTC Calculation: 424 R Axis:   67 Text Interpretation:  Sinus rhythm Ventricular premature complex Confirmed  by Lynann Demetrius  MD, Briani Maul ([id number]) on 10/25/2014 5:14:10 PM      MDM   Final diagnoses:  Fall  Contusion of forehead, initial encounter    2:26 PM 81 y.o. female with a history of dementia, stroke, hypertension who presents from [place] after an unwitnessed fall which occurred about 2 hours ago. Patient has an abrasion to her forehead and the bridge of her nose. She is on eliquis. Vital signs unremarkable here. The patient is alert and oriented 1 and has dementia at baseline. She is unable to complete a neurologic exam with me. She has diffuse pain to palpation. We'll get screening labs and imaging.  Family now present on exam stating pt fell forward from wheelchair hitting her head. She otherwise is at her mental baseline.   CT imaging unremarkable.  Dr. Littie Deeds assumed care while awaiting UA and labs. Plan for d/c back to facility.     Purvis Sheffield, MD 10/26/14 [id number]

## 2014-10-25 NOTE — ED Notes (Signed)
pts diaper changed small amount of fecal material.  Urinated in the diaper cleaned up

## 2014-10-25 NOTE — ED Notes (Signed)
Per EMS: pt coming from [facility] with c/o unwitnessed fall of all around two hours ago. Pt presents with abrasion/hematoma to center forehead and bridge of her nose. Pt is on blood thinners. Pt is alert but disoriented x 4 per her norm. Pt c/o right hip, right arm, and right wrist. Pt resisted immobilization of neck and spine. Unknown LOC

## 2014-10-25 NOTE — ED Notes (Signed)
Pt refusing anything oral.  Unable to get tylenol in her

## 2014-10-25 NOTE — Discharge Instructions (Signed)
Contusion °A contusion is a deep bruise. Contusions are the result of an injury that caused bleeding under the skin. The contusion may turn blue, purple, or yellow. Minor injuries will give you a painless contusion, but more severe contusions may stay painful and swollen for a few weeks.  °CAUSES  °A contusion is usually caused by a blow, trauma, or direct force to an area of the body. °SYMPTOMS  °· Swelling and redness of the injured area. °· Bruising of the injured area. °· Tenderness and soreness of the injured area. °· Pain. °DIAGNOSIS  °The diagnosis can be made by taking a history and physical exam. An X-ray, CT scan, or MRI may be needed to determine if there were any associated injuries, such as fractures. °TREATMENT  °Specific treatment will depend on what area of the body was injured. In general, the best treatment for a contusion is resting, icing, elevating, and applying cold compresses to the injured area. Over-the-counter medicines may also be recommended for pain control. Ask your caregiver what the best treatment is for your contusion. °HOME CARE INSTRUCTIONS  °· Put ice on the injured area. °· Put ice in a plastic bag. °· Place a towel between your skin and the bag. °· Leave the ice on for 15-20 minutes, 3-4 times a day, or as directed by your health care provider. °· Only take over-the-counter or prescription medicines for pain, discomfort, or fever as directed by your caregiver. Your caregiver may recommend avoiding anti-inflammatory medicines (aspirin, ibuprofen, and naproxen) for 48 hours because these medicines may increase bruising. °· Rest the injured area. °· If possible, elevate the injured area to reduce swelling. °SEEK IMMEDIATE MEDICAL CARE IF:  °· You have increased bruising or swelling. °· You have pain that is getting worse. °· Your swelling or pain is not relieved with medicines. °MAKE SURE YOU:  °· Understand these instructions. °· Will watch your condition. °· Will get help right  away if you are not doing well or get worse. °Document Released: 01/06/2005 Document Revised: 04/03/2013 Document Reviewed: 02/01/2011 °ExitCare® Patient Information ©2015 [employer]. This information is not intended to replace advice given to you by your health care provider. Make sure you discuss any questions you have with your health care provider. ° °Urinary Tract Infection °Urinary tract infections (UTIs) can develop anywhere along your urinary tract. Your urinary tract is your body's drainage system for removing wastes and extra water. Your urinary tract includes two kidneys, two ureters, a bladder, and a urethra. Your kidneys are a pair of bean-shaped organs. Each kidney is about the size of your fist. They are located below your ribs, one on each side of your spine. °CAUSES °Infections are caused by microbes, which are microscopic organisms, including fungi, viruses, and bacteria. These organisms are so small that they can only be seen through a microscope. Bacteria are the microbes that most commonly cause UTIs. °SYMPTOMS  °Symptoms of UTIs may vary by age and gender of the patient and by the location of the infection. Symptoms in young women typically include a frequent and intense urge to urinate and a painful, burning feeling in the bladder or urethra during urination. Older women and men are more likely to be tired, shaky, and weak and have muscle aches and abdominal pain. A fever may mean the infection is in your kidneys. Other symptoms of a kidney infection include pain in your back or sides below the ribs, nausea, and vomiting. °DIAGNOSIS °To diagnose a UTI, your caregiver   will ask you about your symptoms. Your caregiver also will ask to provide a urine sample. The urine sample will be tested for bacteria and white blood cells. White blood cells are made by your body to help fight infection. °TREATMENT  °Typically, UTIs can be treated with medication. Because most UTIs are caused by a bacterial  infection, they usually can be treated with the use of antibiotics. The choice of antibiotic and length of treatment depend on your symptoms and the type of bacteria causing your infection. °HOME CARE INSTRUCTIONS °· If you were prescribed antibiotics, take them exactly as your caregiver instructs you. Finish the medication even if you feel better after you have only taken some of the medication. °· Drink enough water and fluids to keep your urine clear or pale yellow. °· Avoid caffeine, tea, and carbonated beverages. They tend to irritate your bladder. °· Empty your bladder often. Avoid holding urine for long periods of time. °· Empty your bladder before and after sexual intercourse. °· After a bowel movement, women should cleanse from front to back. Use each tissue only once. °SEEK MEDICAL CARE IF:  °· You have back pain. °· You develop a fever. °· Your symptoms do not begin to resolve within 3 days. °SEEK IMMEDIATE MEDICAL CARE IF:  °· You have severe back pain or lower abdominal pain. °· You develop chills. °· You have nausea or vomiting. °· You have continued burning or discomfort with urination. °MAKE SURE YOU:  °· Understand these instructions. °· Will watch your condition. °· Will get help right away if you are not doing well or get worse. °Document Released: 01/06/2005 Document Revised: 09/28/2011 Document Reviewed: 05/07/2011 °ExitCare® Patient Information ©2015 [employer]. This information is not intended to replace advice given to you by your health care provider. Make sure you discuss any questions you have with your health care provider. ° ° °

## 2014-10-25 NOTE — ED Notes (Signed)
In xray

## 2014-10-25 NOTE — ED Notes (Signed)
cathed   Stools x 2  In her diaper.  Her labia is red and irritated

## 2014-10-25 NOTE — ED Notes (Signed)
The pt is ready to go back to [facility]  i have attempted several times to get them on the phone.  Finally someone answered  Report given.  ptar called for transport back

## 2014-10-25 NOTE — ED Provider Notes (Signed)
Assumed care of pt from Dr. Romeo AppleHarrison.  Briefly, pt is a 81 y.o. female with dementia presenting with reportedly mechanical fall from wheelchair.  Traumatic wu unremarkable, however pt has a leukocytosis, questionable pneumonia, as well as nitrite positive urine without white blood cells or bacteria.  Will treat empirically with Levaquin. Patient does not have a fever, is well-appearing. No reported cough. Discharged home in stable condition.  1. Contusion of forehead, initial encounter   2. Nicholaus BloomFall      Matthew Gentry, MD 10/25/14 2049

## 2014-10-25 NOTE — ED Notes (Signed)
Asleep.  Family went home

## 2014-10-27 LAB — URINE CULTURE

## 2014-10-28 ENCOUNTER — Telehealth: Payer: Self-pay | Admitting: Family Medicine

## 2014-10-28 NOTE — Telephone Encounter (Signed)
Called from nursing home stating that family members would like an xray of her right hand. Nurse reports that the right hand has been "contracted" since the fall three days ago. Patient reports some pain in hand when touched, but otherwise not complaining about pain. Pain has not interfered with sleep since fall and evaluation in ED. She is not currently endorsing or showing any signs of pain.  - Non-urgent question; [facility] during day time and consideration of imaging.

## 2014-10-29 NOTE — Telephone Encounter (Signed)
Reviewed phone note and [facility]. Per report, pt's extremity is not swollen and not painful except with certain provocation. Will evaluate following my last pt in clinic today.

## 2014-10-30 ENCOUNTER — Encounter: Payer: Self-pay | Admitting: Family Medicine

## 2014-10-30 ENCOUNTER — Non-Acute Institutional Stay: Payer: PPO | Admitting: Family Medicine

## 2014-10-30 DIAGNOSIS — M21331 Wrist drop, right wrist: Secondary | ICD-10-CM | POA: Diagnosis not present

## 2014-10-30 DIAGNOSIS — W102XXS Fall (on)(from) incline, sequela: Secondary | ICD-10-CM

## 2014-10-30 NOTE — Telephone Encounter (Signed)
[facility] Visit   HISTORY OF PRESENT ILLNESS: Shannon Stokes is a @AGE2AMB @  female.    CC: right wrist drop.   HPI:  [facility] with abrasion to forehead and bridge of nose. On Eliquis.   CT head and Maxilofacial 7/15: No acute intracranial processes, No acute bony injury to cerival spine nor maxillofacial bones.  Physical exam in ED Afeb, 153/55, P67 SaO2 = 96%. Normal rangoe of motion of the arms, elbows, wrists without significant pain. No acute BMET changes, WBC 18K, Hgb 11.  INR 1.36 Urinalysis Neg LE/ post Nit / 0-2 WBC. Urine Culture grew multiple species.  ECG: NSR with PVC, QTc 405 s CXR: Asymmetric Air Space disease ED Physician assessed as possible Pneumonia, possible UTI, contusion of forehead, and Fall.  They started Levaquin and discharged patient back to NH.   On 10/28/14 @ ~ 9 pm, [facility] reporting that family members note right hand has been contracted since fall.    There has been no improvement in the change in right hand / wrist today.   History Patient Active Problem List   Diagnosis Date Noted  . Insomnia due to medical condition 03/01/2013  . Fall at nursing home 12/05/2012  . Intermittent atrial fibrillation 11/26/2012  . 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 Hemiplegia affecting nondominant side 11/24/2012  . Personal history of fall 08/22/2012  . Long toenail 08/21/2012  . Dementia with behavioral disturbance 03/11/2010  . GAIT IMBALANCE 03/11/2010  . HTN (hypertension) 03/11/2010    Medications has a current medication list which includes the following prescription(s): acetaminophen, feeding supplement (pro-stat sugar free 64), apixaban, atorvastatin, calcium citrate, levofloxacin, mirtazapine, and vitamin d (ergocalciferol).     Falls in the past six months: Yes       Wt Readings from Last 3 Encounters:   07/17/14 141 lb 12.8 oz (64.32 kg)  04/15/14 139 lb (63.05 kg)  01/13/14 138 lb 6.4 oz (62.778 kg)     Review of Systems: Unable to assess secondary to dementia  PHYSICAL EXAM:. VS 10/27/14: 118/68  P69  96.36F  RR18 General: No acute distress, rambling speech pleasant HEENT:  PERRL, pupils 2-3 mm bilaterally reactive to light Neck:  No tenderness to palpation of cervical spinous processes Extremity: Right wrist flaccid weakness in flexion postion. TPP at right wrist.  No tenderness in anatomical snuff box. No deformities of right wrist or hand or elbow or arm or forearm. Left wrist with normal muscle tone held in neutral position. No TTP at wrist.    Neurologic:  Cranial nerves 2-12 grossly intact, normal tone in extremities Except flaccid right wrist and hand, unable to actively extend at right wrist, poor grip strength right hand.  Normal strength 5/5 elbow flex extension; Left wrist 5/5 wrist flexion and extension.  Fixed right should with passive ROM to 30 degrees.  Normal symmetric strength in legs bilat. Intact to touch in upper limbs bilaterally symmetric.  Right radial pulse is 2(+). Right limb and hand are appropriately warm to palpation.  No facial droop. Psych:  Alert, cooperative, following one step commends.  Assessment and Plan:    1. Right Wrist drop - Following fall with head contusion - weakness in just right radial nerve motor distribution - TTP right wrist and arm and forearm - head and Cervical CT were unremarkable in ED - Ddx: Lower  Vs upper neural injury: working diagnosis is lower peripheral nerve injury given focal motor deficiency that appears to be in a right radial nerve distribution. Given temporal relation to fall with TTP of right arm, traumatic injury to radial nerve is possible.   Recommendation 1. Imaging of right hand/wrist/elbow/humerus and shoulder. Recommend using outpatient radiology Xrays to allow our service to view images.  2. Splint right  wrist and fingers/thumb into neutral positions 3. If wrist drop fails to show improvement over a week, arrange EMG/NCD of right upper extremity to assess for peripheral neuropathy and location of injury.  4. If other right sided neurologic focalities arise, consider brain MRI to look for Upper tract neuron injuries.

## 2014-10-30 NOTE — Progress Notes (Signed)
Patient ID: Shannon CurlingVallie B Munley, female   DOB: 1916-11-01, 81 y.o.   MRN: [id number] [facility]   HISTORY OF PRESENT ILLNESS: Shannon Stokes is a @AGE2AMB @  female.    CC: right wrist drop.   HPI:  [facility] with abrasion to forehead and bridge of nose. On Eliquis.   CT head and Maxilofacial 7/15: No acute intracranial processes, No acute bony injury to cerival spine nor maxillofacial bones.  Physical exam in ED Afeb, 153/55, P67 SaO2 = 96%. Normal rangoe of motion of the arms, elbows, wrists without significant pain. No acute BMET changes, WBC 18K, Hgb 11.  INR 1.36 Urinalysis Neg LE/ post Nit / 0-2 WBC. Urine Culture grew multiple species.  ECG: NSR with PVC, QTc 405 s CXR: Asymmetric Air Space disease ED Physician assessed as possible Pneumonia, possible UTI, contusion of forehead, and Fall.  They started Levaquin and discharged patient back to NH.   On 10/28/14 @ ~ 9 pm, [facility] reporting that family members note right hand has been contracted since fall.    There has been no improvement in the change in right hand / wrist today.   History Patient Active Problem List   Diagnosis Date Noted  . Insomnia due to medical condition 03/01/2013  . Fall at nursing home 12/05/2012  . Intermittent atrial fibrillation 11/26/2012  . 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 Hemiplegia affecting nondominant side 11/24/2012  . Personal history of fall 08/22/2012  . Long toenail 08/21/2012  . Dementia with behavioral disturbance 03/11/2010  . GAIT IMBALANCE 03/11/2010  . HTN (hypertension) 03/11/2010    Medications has a current medication list which includes the following prescription(s): acetaminophen, feeding supplement (pro-stat sugar free 64), apixaban, atorvastatin, calcium citrate, levofloxacin, mirtazapine, and vitamin d (ergocalciferol).     Falls in  the past six months: Yes       Wt Readings from Last 3 Encounters:  07/17/14 141 lb 12.8 oz (64.32 kg)  04/15/14 139 lb (63.05 kg)  01/13/14 138 lb 6.4 oz (62.778 kg)     Review of Systems: Unable to assess secondary to dementia  PHYSICAL EXAM:. VS 10/27/14: 118/68  P69  96.64F  RR18 General: No acute distress, rambling speech pleasant HEENT:  PERRL, pupils 2-3 mm bilaterally reactive to light Neck:  No tenderness to palpation of cervical spinous processes Extremity: Right wrist flaccid weakness in flexion postion. TPP at right wrist.  No tenderness in anatomical snuff box. No deformities of right wrist or hand or elbow or arm or forearm. Left wrist with normal muscle tone held in neutral position. No TTP at wrist.    Neurologic:  Cranial nerves 2-12 grossly intact, normal tone in extremities Except flaccid right wrist and hand, unable to actively extend at right wrist, poor grip strength right hand.  Normal strength 5/5 elbow flex extension; Left wrist 5/5 wrist flexion and extension.  Fixed right should with passive ROM to 30 degrees.  Normal symmetric strength in legs bilat. Intact to touch in upper limbs bilaterally symmetric.  Right radial pulse is 2(+). Right limb and hand are appropriately warm to palpation.  No facial droop. Psych:  Alert, cooperative, following one step commends.  Assessment and Plan:    1. Right Wrist drop - Following fall with head contusion - weakness in just right radial nerve motor distribution - TTP right wrist  and arm and forearm - head and Cervical CT were unremarkable in ED - Ddx: Lower Vs upper neural injury: working diagnosis is lower peripheral nerve injury given focal motor deficiency that appears to be in a right radial nerve distribution. Given temporal relation to fall with TTP of right arm, traumatic injury to radial nerve is possible.   Recommendation 1. Imaging of right hand/wrist/elbow/humerus and shoulder. Recommend using  outpatient radiology Xrays to allow our service to view images.  2. Splint right wrist and fingers/thumb into neutral positions 3. If wrist drop fails to show improvement over a week, arrange EMG/NCD of right upper extremity to assess for peripheral neuropathy and location of injury.  4. If other right sided neurologic focalities arise, consider brain MRI to look for Upper tract neuron injuries.

## 2014-11-05 ENCOUNTER — Other Ambulatory Visit: Payer: Self-pay | Admitting: Family Medicine

## 2014-11-05 ENCOUNTER — Ambulatory Visit (HOSPITAL_COMMUNITY)
Admission: RE | Admit: 2014-11-05 | Discharge: 2014-11-05 | Disposition: A | Discharge status: Home / Self Care | Payer: PPO | Source: Ambulatory Visit | Attending: Family Medicine | Admitting: Family Medicine

## 2014-11-05 DIAGNOSIS — M25531 Pain in right wrist: Secondary | ICD-10-CM | POA: Insufficient documentation

## 2014-11-05 DIAGNOSIS — Y92129 Unspecified place in nursing home as the place of occurrence of the external cause: Secondary | ICD-10-CM | POA: Diagnosis not present

## 2014-11-05 DIAGNOSIS — W19XXXA Unspecified fall, initial encounter: Secondary | ICD-10-CM | POA: Insufficient documentation

## 2014-11-05 DIAGNOSIS — R52 Pain, unspecified: Secondary | ICD-10-CM

## 2014-12-08 ENCOUNTER — Non-Acute Institutional Stay: Payer: PPO | Admitting: Family Medicine

## 2014-12-08 DIAGNOSIS — I10 Essential (primary) hypertension: Secondary | ICD-10-CM

## 2014-12-08 DIAGNOSIS — W19XXXD Unspecified fall, subsequent encounter: Secondary | ICD-10-CM | POA: Diagnosis not present

## 2014-12-08 DIAGNOSIS — I480 Paroxysmal atrial fibrillation: Secondary | ICD-10-CM | POA: Diagnosis not present

## 2014-12-08 DIAGNOSIS — Y92129 Unspecified place in nursing home as the place of occurrence of the external cause: Secondary | ICD-10-CM

## 2014-12-08 NOTE — Assessment & Plan Note (Signed)
Well controlled no changes 

## 2014-12-08 NOTE — Progress Notes (Signed)
[facility]  Visit  Primary Care Provider: Clare Gandy  Location of Care: [facility] Visit Information: a scheduled routine follow-up visit Source(s) of information for visit: patient  Chief Complaint: Interval visit   Nursing Concerns: none   Nutrition Concerns: patient's weight has remained relatively stable over the course of the last year.   Wound Care Nurse Concerns: n/a   Fall: recent hx of unwitnessed fall in July. Imaging was negative. She was evaluated in the ED and given levaquin for possible PNA and UTI.  She was placed in a right wrist splint for wrist drop.  Afib: remains in NSR, denies any chest pain or palpitation  - remains Eliquis and lipitor  - remains of beta blockers   HTN: remains well controlled  - has been taken off beta blockers   HISTORY OF PRESENT ILLNESS: Outpatient Encounter Prescriptions as of 12/08/2014  Medication Sig  . acetaminophen (TYLENOL) 325 MG tablet Take 650 mg by mouth 3 (three) times daily.  . Amino Acids-Protein Hydrolys (FEEDING SUPPLEMENT, PRO-STAT SUGAR FREE 64,) LIQD Take 30 mLs by mouth daily before lunch.  Marland Kitchen apixaban (ELIQUIS) 2.5 MG TABS tablet Take 2.5 mg by mouth 2 (two) times daily.  Marland Kitchen atorvastatin (LIPITOR) 20 MG tablet Take 20 mg by mouth daily.  . calcium citrate (CALCITRATE - DOSED IN MG ELEMENTAL CALCIUM) 950 MG tablet Take 2 tablets by mouth daily.  . mirtazapine (REMERON) 15 MG tablet Take 15 mg by mouth at bedtime.  . Vitamin D, Ergocalciferol, (DRISDOL) 50000 UNITS CAPS capsule Take 50,000 Units by mouth every 30 (thirty) days.  . [DISCONTINUED] levofloxacin (LEVAQUIN) 500 MG tablet Take 1 tablet (500 mg total) by mouth daily.   No facility-administered encounter medications on file as of 12/08/2014.   No Known Allergies History Patient Active Problem List   Diagnosis Date Noted  . Insomnia due to medical condition 03/01/2013  . Fall at nursing home 12/05/2012  . Intermittent atrial  fibrillation 11/26/2012  . History of stroke with residual effects 11/24/2012  . Hemiplegia affecting nondominant side 11/24/2012  . Dementia with behavioral disturbance 03/11/2010  . GAIT IMBALANCE 03/11/2010  . HTN (hypertension) 03/11/2010   Past Medical History  Diagnosis Date  . Hypertension   . Stroke   . Dementia with behavioral disturbance 03/11/2010   Past Surgical History  Procedure Laterality Date  . Appendectomy    . Ankle surgery      "as a child"   No family history on file.  reports that she has never smoked. She does not have any smokeless tobacco history on file. She reports that she does not drink alcohol.  Falls in the past six months:   yes  Diet:  general  Nutritional Supplements:  Medpass: yes Magic Cup:yes  Prostat:yes  Juven:no    Review of Systems  Patient has ability to communicate answers to ROS: no See HPI  PHYSICAL EXAM:. Wt Readings from Last 3 Encounters:  12/08/14 136 lb 0.6 oz (61.707 kg)  07/17/14 141 lb 12.8 oz (64.32 kg)  04/15/14 139 lb (63.05 kg)   Temp Readings from Last 3 Encounters:  12/08/14 98.1 F (36.7 C)   10/27/14 96.5 F (35.8 C)   10/25/14 97.4 F (36.3 C)    BP Readings from Last 3 Encounters:  12/08/14 132/74  10/27/14 118/68  10/25/14 102/80   Pulse Readings from Last 3 Encounters:  12/08/14 78  10/27/14 69  10/25/14 74    General: alert, pleasant, clean, groomed HEENT:  No scleral icterus, no nasal secretions, EACs not occluded,  Neck:  Supple, No JVD, no lymphadenopathy CV:  RRR, no murmur, no ankle swelling RESP: No resp distress or accessory muscle use.  Clear to ausc bilat. No wheezing, no rales, no rhonchi.  ABD:  Soft, Non-tender, non-distended, +bowel sounds, no masses MSK:  Moving all ext freely Right hand: no obvious wrist drop, Grip strength has improved, Neurovascularly intact, no TTP in snuffbox, no ecchymosis or erythema  EXT: Warm and well perfused   no edema, no erythema, pulses  WNL Neurologic:Cranial nerves normal;  Muscle Tone within normal limits;  Psych:  Alert, cooperative and follows one step commands.    Assessment and Plan:   See Problem List for individual problem's assessment and plans.   Family Communications: Ronda Fairly HCPOA

## 2014-12-08 NOTE — Assessment & Plan Note (Addendum)
Remains NSR  Recent hx of fall and HAS-BLED score of 2 - will continue eliquis.

## 2014-12-09 NOTE — Assessment & Plan Note (Signed)
Discussed with Dr. McDiarmid.  She has gotten strength back and no longer significant drop as it was  Will encourage to wear the splint as PRN or during meals  PT and OT are ordered  - no further imaging at this point

## 2014-12-11 NOTE — Progress Notes (Signed)
Patient ID: Shannon Stokes, female   DOB: [birth year], 81 y.o.   MRN: [id number] I have seen and examined this patient. I have reviewed labs and imaging results.  I have discussed with Dr Jordan Likes.  I agree with their findings and plans as documented in their regulatory visit note.  Right wrist drop, traumatic - Continues to improve.  - Patient able to actively extend wrist and all fingers of her right arm/hand. - Recommend stopping wrist splint of right wrist/hand.

## 2014-12-17 ENCOUNTER — Encounter: Payer: Self-pay | Admitting: Pharmacist

## 2015-01-31 ENCOUNTER — Encounter: Payer: Self-pay | Admitting: Pharmacist

## 2015-02-02 ENCOUNTER — Telehealth: Payer: Self-pay | Admitting: Family Medicine

## 2015-02-02 NOTE — Telephone Encounter (Signed)
Went to evaluate patient. She is audibly breathing, almost sounds like snoring. Thin, fragile. She is curled up in the fetal position with her legs flexed, per nursing staff this is her baseline. She intermittently opens her eyes. She does not follow commands. She responds to sternal rubbing and attempting to stretch out her legs/arms. PERRL. Lungs CTAB without wheezing, rhonchi, or crackles without increased WOB. RRR without m/r/g noted. No pitting edema. No evidence of head trauma.   [facility], pt was more sleepy with decreased PO intake on 10/19. At that time a BMET was ordered but it was felt that this was secondary to progression of her dementia. Unfortunately the staff is unable to locate those results. Per their report, her positioning (lying in the fetal position with frustration with attempts to extend her legs) is stable. She is less responsive and interactive however. No history of falls within the last month.  This very well may be progression of her dementia, failure to thrive, and possible an effect of decreasing her Remeron.   - Obtain BMET to assess for electrolyte disturbance.  - Urge patient to take more PO (per report likes apple juice) - If continues to have poor PO intake, nursing staff to contact me, will start IVF as no MOST form present stating she would not wish to have IV hydration.  Joanna Puffrystal S. Dorrie Cocuzza, MD [facility] Resident  02/02/2015, 10:40 PM

## 2015-02-02 NOTE — Telephone Encounter (Signed)
AFTER HOUR LINES   Called  by [name]. Per notes, the patient has had poor PO and fatigue since the 19th. This evening she was noted to have "audible breathing." Per primary nurse, she did not have any increased work of breathing, retractions, etc. Unable to follow commands, however does resist movement of her UEs.  Per nurse, she has poor skin turgor.  RR of 24, BP 108/54, HR 97, Temp 99.8, 96% on RA.   Patient had labs from 10/19 per nurse but she can't currently locate it. The patient is a DNR. No MOST form on record for the patient.  Will attempt to evaluate the patient this evening.   Shannon Puffrystal S. Dorsey, MD [facility] Resident  02/02/2015, 9:50 PM

## 2015-02-03 NOTE — Telephone Encounter (Signed)
I have discussed this with her POA and we are waiting on a UA. If it looks infected we will treat and give her a fluid bolus, otherwise we will consider this progression of her dementia and focus on comfort. They may call you about the UA and the POA said it is fine to call him any time with questions. Just FYI, her BMP looks dry and hypernatremic and they could only get a 5cc sample when they cathed her and it was brown which is why we're still waiting on a larger sample for UA to get sent. Call me if you have questions. Thanks!

## 2015-02-11 DEATH — deceased

## 2015-02-12 ENCOUNTER — Non-Acute Institutional Stay (INDEPENDENT_AMBULATORY_CARE_PROVIDER_SITE_OTHER): Payer: PPO | Admitting: Family Medicine

## 2015-02-12 DIAGNOSIS — Z515 Encounter for palliative care: Secondary | ICD-10-CM | POA: Insufficient documentation

## 2015-02-12 NOTE — Progress Notes (Signed)
Patient ID: Shannon Stokes, female   DOB: Apr 30, 1916, 81 y.o.   MRN: [id number] Patient is Deceased

## 2016-01-24 IMAGING — DX DG HUMERUS 2V *R*
2 series · 2 of 2 positions shown · non-contrast
Comparison: None.

CLINICAL DATA: Fall at the [HOSPITAL] 5 days ago. RIGHT upper
extremity pain. Nonspecific pain. Restricted range of motion.
Initial encounter.

EXAM:
RIGHT HUMERUS - 2+ VIEW

[t humerus ap right]
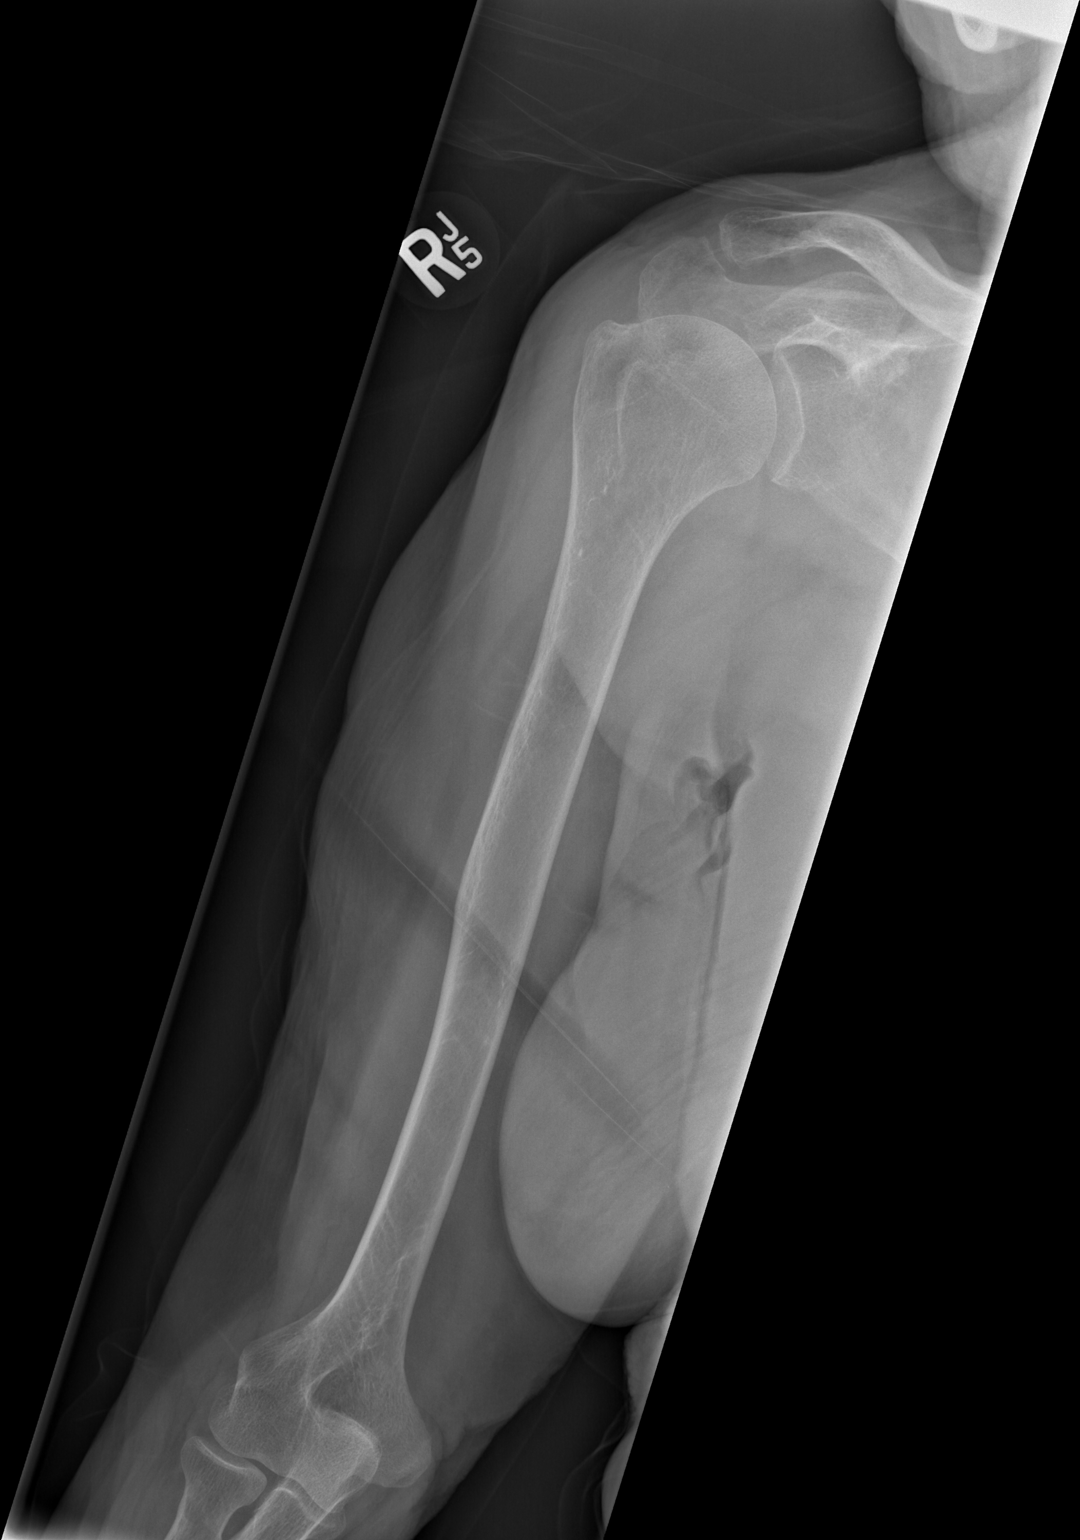

[t humerus lat right]
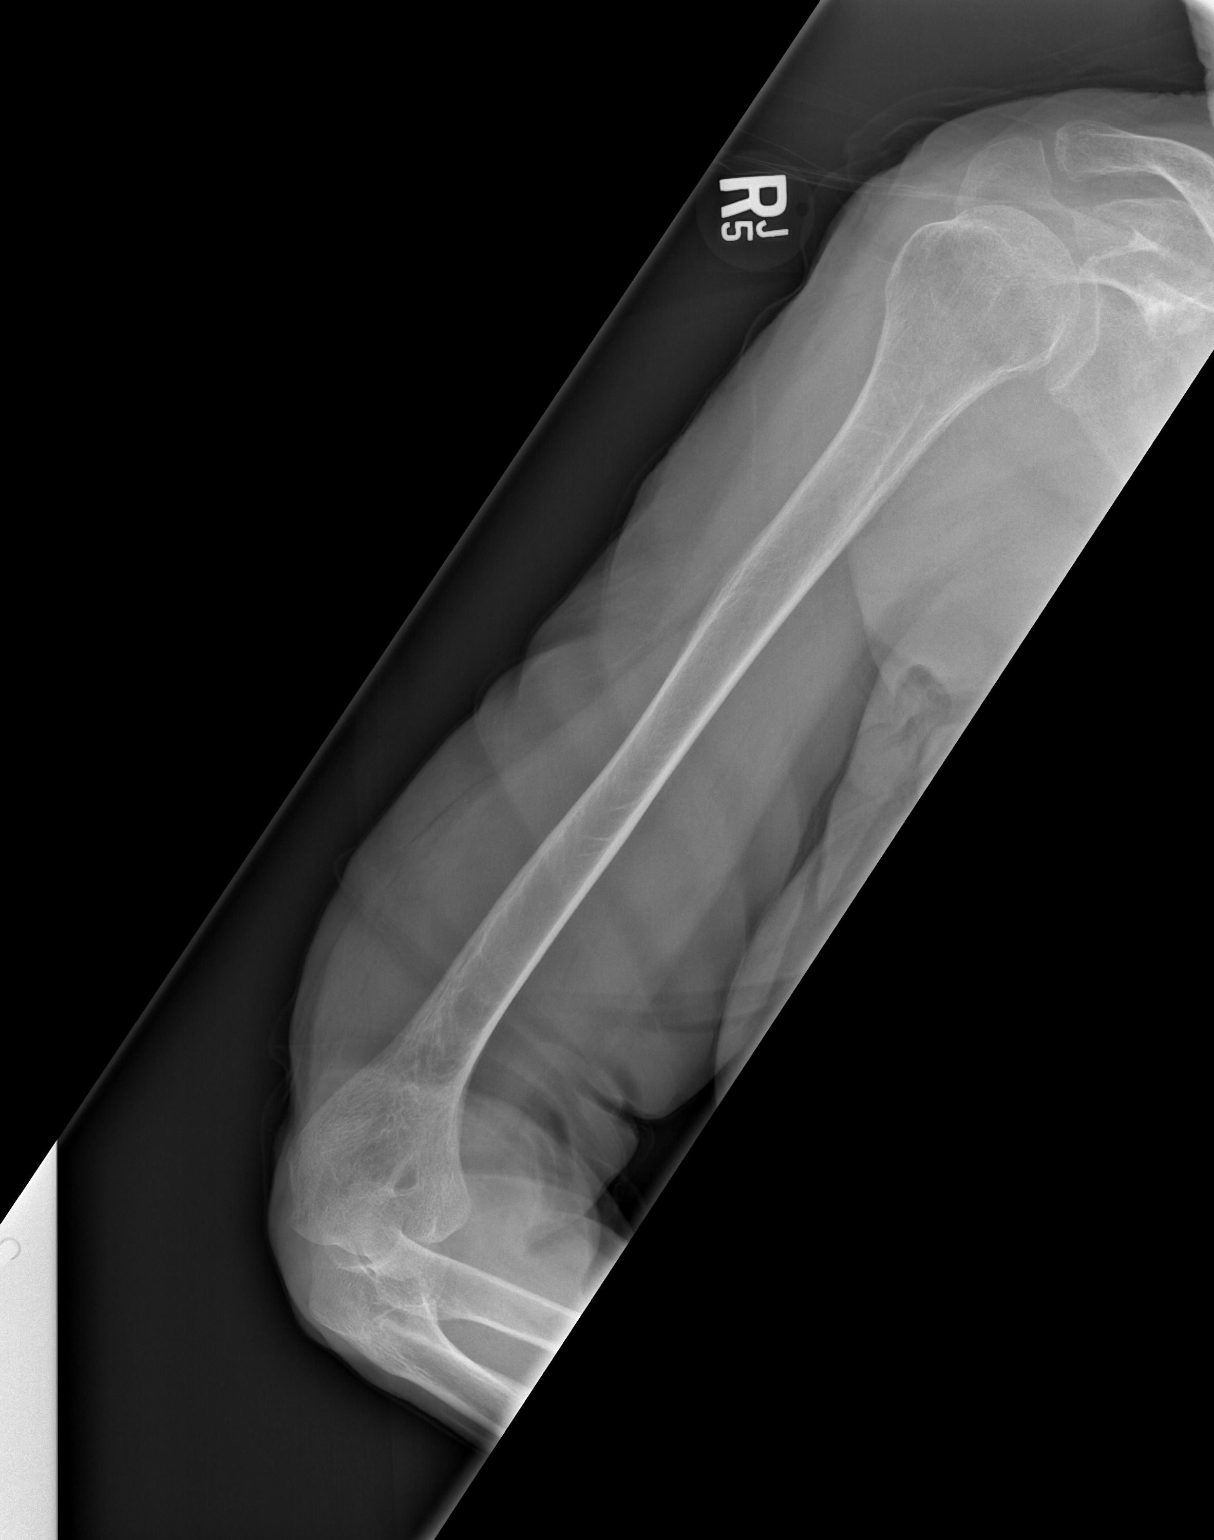

[2 of 2 positions shown; findings below may reference images not displayed]

FINDINGS: There is no evidence of fracture or other focal bone lesions. Soft
tissues are unremarkable.
IMPRESSION: Negative.

## 2016-01-24 IMAGING — DX DG WRIST COMPLETE 3+V*R*
3 series · 3 of 3 positions shown · non-contrast
Comparison: None.

CLINICAL DATA: Fall at the [HOSPITAL] 5 days ago. RIGHT upper
extremity pain. Nonspecific pain. Restricted range of motion.
Initial encounter.

EXAM:
RIGHT WRIST - COMPLETE 3+ VIEW

[x wrist lat right]
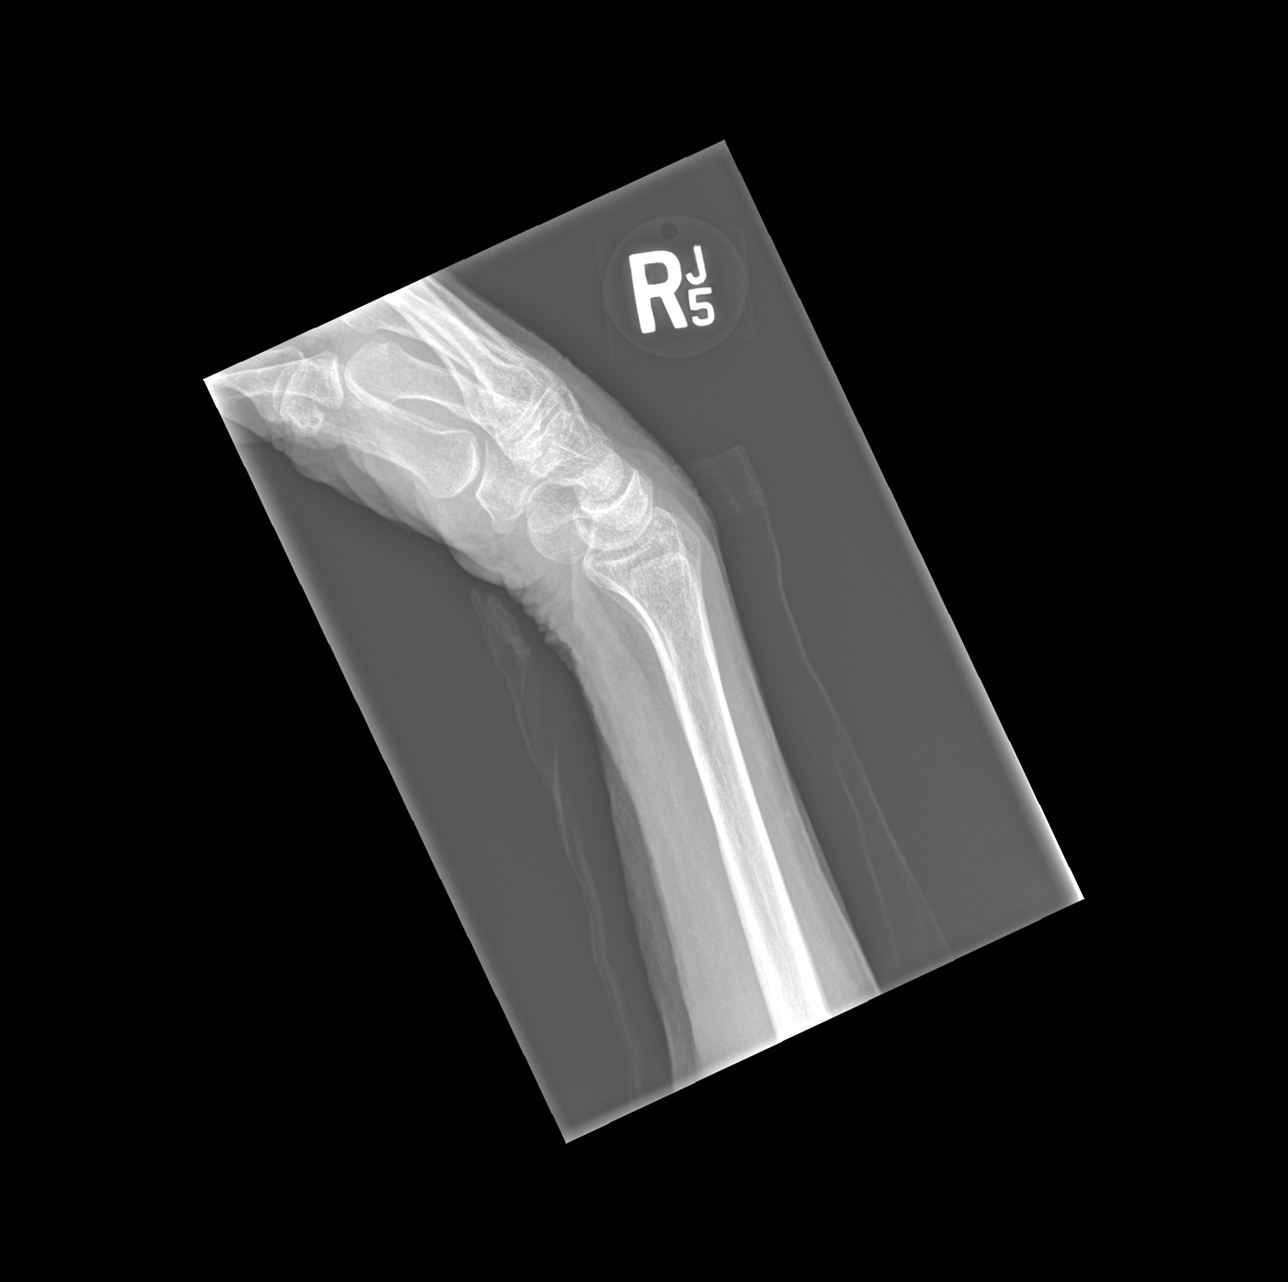

[x wrist obl right]
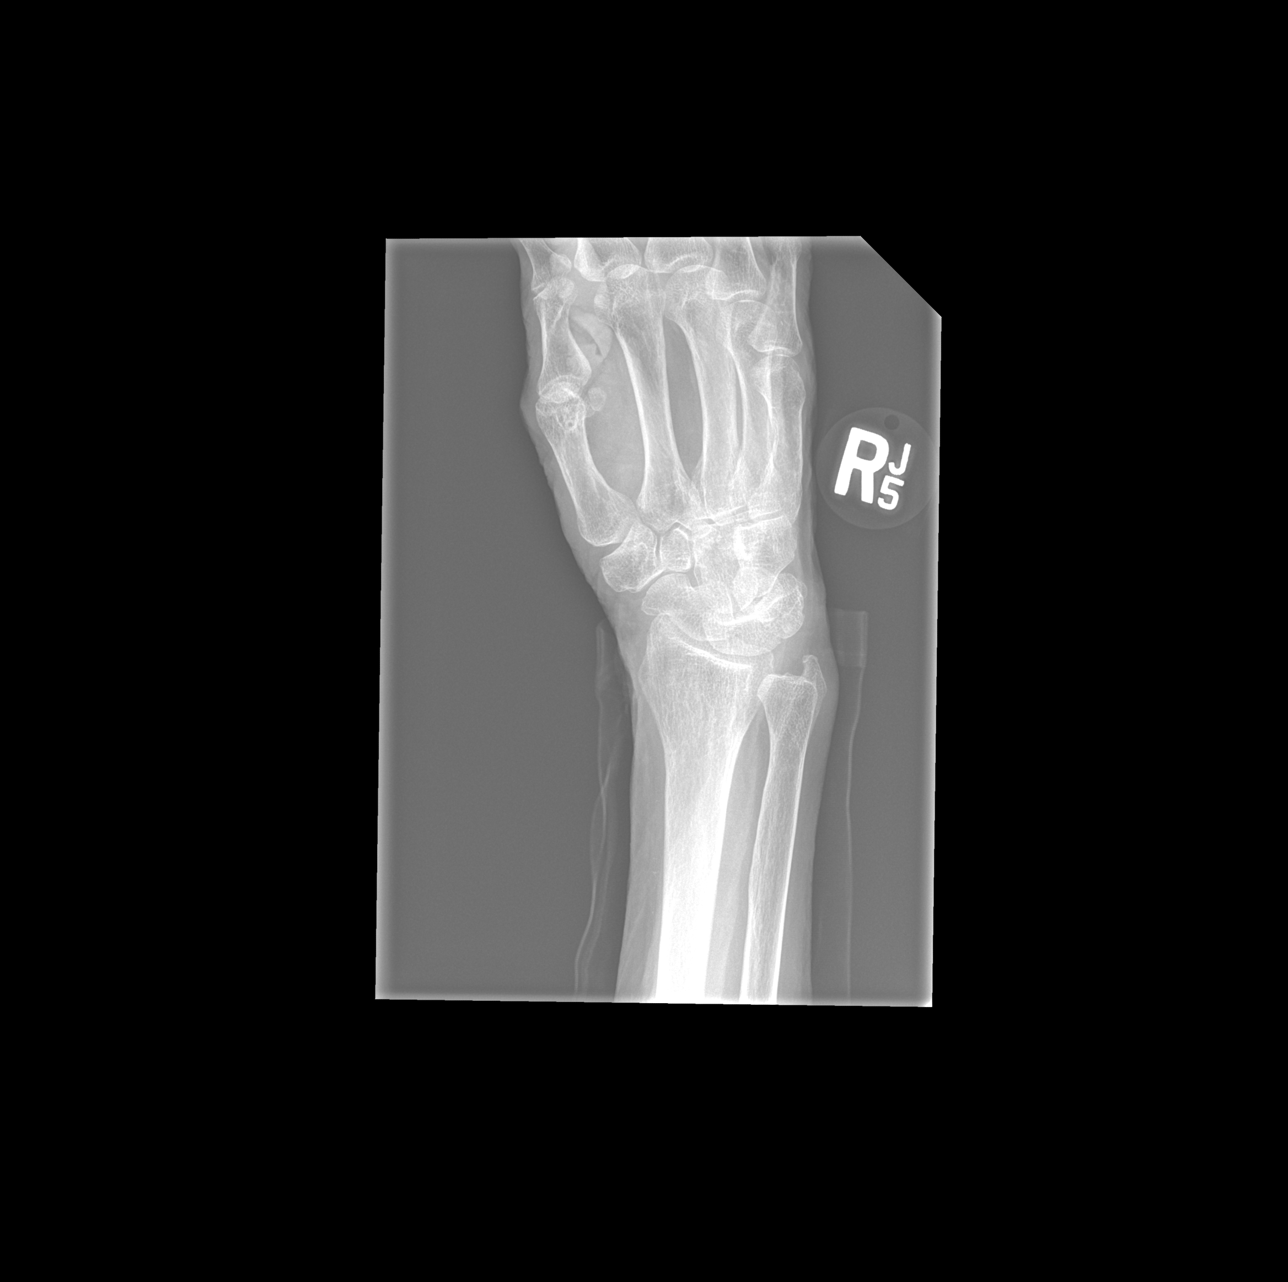

[x wrist pa right]
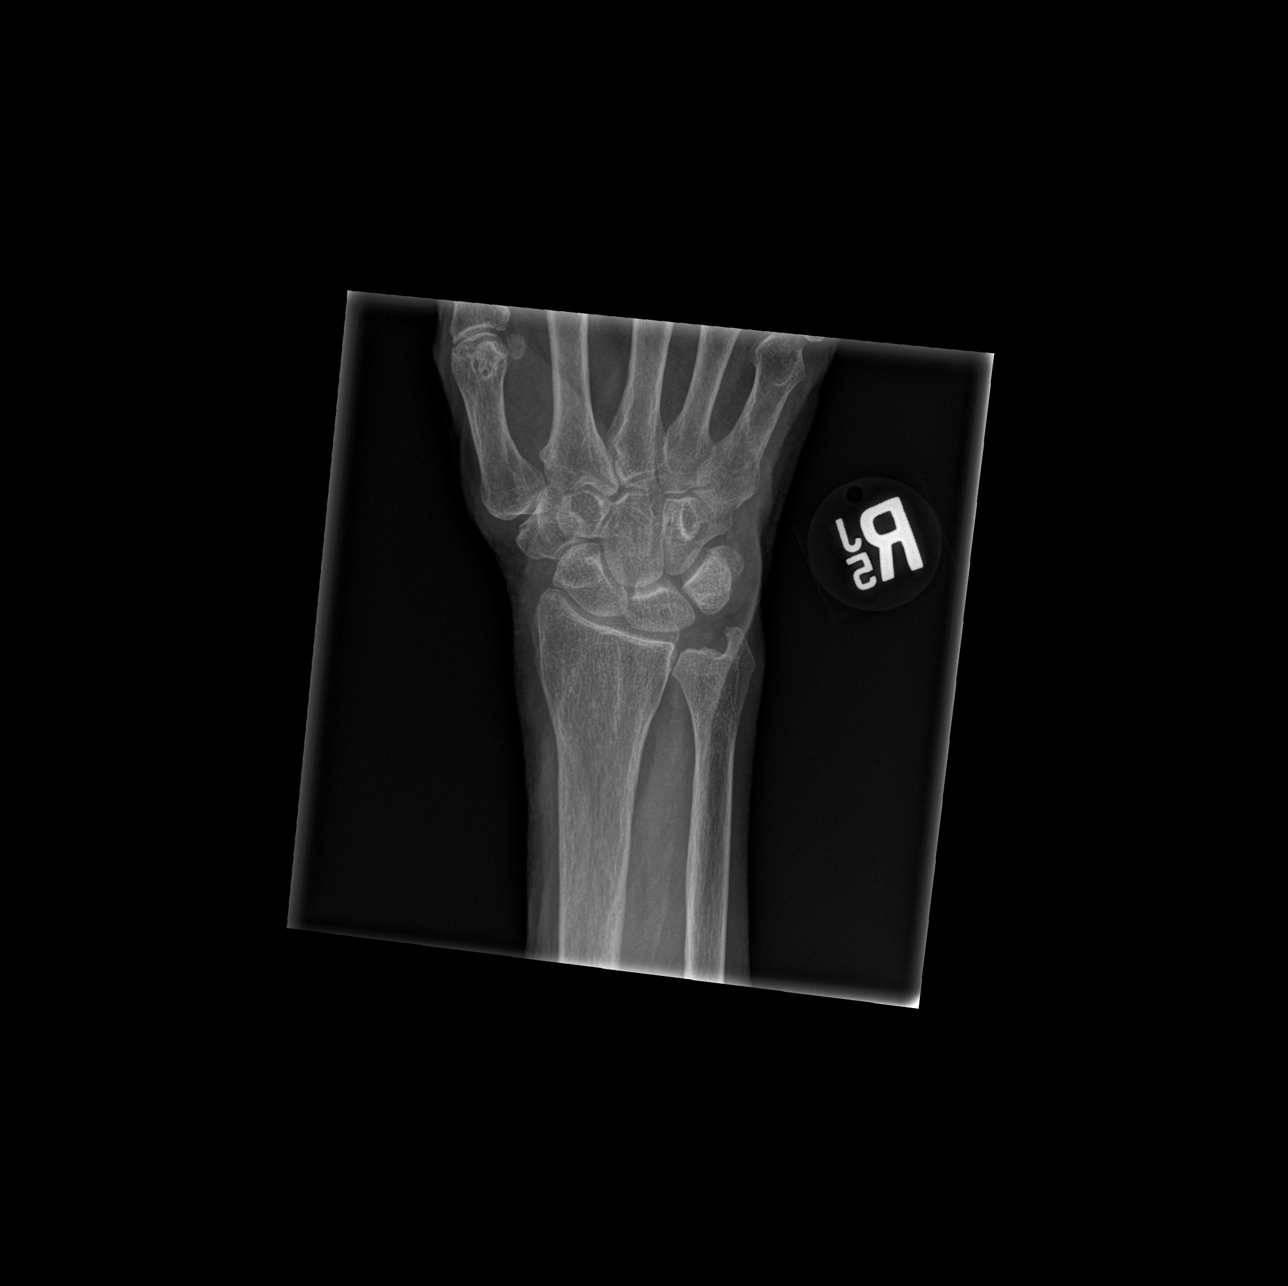

[3 of 3 positions shown; findings below may reference images not displayed]

FINDINGS: There is no evidence of fracture or dislocation. There is no
evidence of arthropathy or other focal bone abnormality. Soft
tissues are unremarkable.
IMPRESSION: Negative.
